# Patient Record
Sex: Female | Born: 1955 | Race: White | Hispanic: No | Marital: Married | Smoking: Never smoker
Health system: Southern US, Community
[De-identification: ages and names within clinical notes are randomized; demographics above are authoritative.]

## PROBLEM LIST (undated history)

## (undated) DIAGNOSIS — G709 Myoneural disorder, unspecified: Secondary | ICD-10-CM

## (undated) DIAGNOSIS — M1990 Unspecified osteoarthritis, unspecified site: Secondary | ICD-10-CM

## (undated) DIAGNOSIS — J189 Pneumonia, unspecified organism: Secondary | ICD-10-CM

## (undated) DIAGNOSIS — D494 Neoplasm of unspecified behavior of bladder: Secondary | ICD-10-CM

## (undated) DIAGNOSIS — R350 Frequency of micturition: Secondary | ICD-10-CM

## (undated) DIAGNOSIS — R011 Cardiac murmur, unspecified: Secondary | ICD-10-CM

## (undated) DIAGNOSIS — R112 Nausea with vomiting, unspecified: Secondary | ICD-10-CM

## (undated) DIAGNOSIS — C801 Malignant (primary) neoplasm, unspecified: Secondary | ICD-10-CM

## (undated) DIAGNOSIS — I10 Essential (primary) hypertension: Secondary | ICD-10-CM

## (undated) DIAGNOSIS — K759 Inflammatory liver disease, unspecified: Secondary | ICD-10-CM

## (undated) DIAGNOSIS — H269 Unspecified cataract: Secondary | ICD-10-CM

## (undated) DIAGNOSIS — E039 Hypothyroidism, unspecified: Secondary | ICD-10-CM

## (undated) DIAGNOSIS — I209 Angina pectoris, unspecified: Secondary | ICD-10-CM

## (undated) DIAGNOSIS — Z9889 Other specified postprocedural states: Secondary | ICD-10-CM

## (undated) HISTORY — PX: ABLATION: SHX5711

## (undated) HISTORY — PX: CARDIAC CATHETERIZATION: SHX172

## (undated) HISTORY — PX: DILATION AND CURETTAGE OF UTERUS: SHX78

## (undated) HISTORY — PX: CATARACT EXTRACTION, BILATERAL: SHX1313

## (undated) HISTORY — PX: ABDOMINAL HYSTERECTOMY: SHX81

## (undated) HISTORY — PX: CARDIOVASCULAR STRESS TEST: SHX262

---

## 2000-06-19 HISTORY — PX: TOTAL ABDOMINAL HYSTERECTOMY W/ BILATERAL SALPINGOOPHORECTOMY: SHX83

## 2007-06-20 HISTORY — PX: ABDOMINOPLASTY: SUR9

## 2009-01-16 ENCOUNTER — Inpatient Hospital Stay (HOSPITAL_COMMUNITY): Admission: EM | Admit: 2009-01-16 | Discharge: 2009-01-17 | Payer: Self-pay | Admitting: Internal Medicine

## 2009-01-16 ENCOUNTER — Ambulatory Visit: Payer: Self-pay | Admitting: Diagnostic Radiology

## 2010-03-08 DIAGNOSIS — E785 Hyperlipidemia, unspecified: Secondary | ICD-10-CM | POA: Insufficient documentation

## 2010-03-08 DIAGNOSIS — I10 Essential (primary) hypertension: Secondary | ICD-10-CM | POA: Insufficient documentation

## 2010-03-09 ENCOUNTER — Ambulatory Visit: Payer: Self-pay | Admitting: Internal Medicine

## 2010-03-09 DIAGNOSIS — R05 Cough: Secondary | ICD-10-CM

## 2010-03-09 DIAGNOSIS — R0602 Shortness of breath: Secondary | ICD-10-CM | POA: Insufficient documentation

## 2010-03-09 DIAGNOSIS — R059 Cough, unspecified: Secondary | ICD-10-CM | POA: Insufficient documentation

## 2010-04-07 ENCOUNTER — Ambulatory Visit: Payer: Self-pay | Admitting: Internal Medicine

## 2010-04-26 ENCOUNTER — Encounter: Payer: Self-pay | Admitting: Internal Medicine

## 2010-04-26 ENCOUNTER — Ambulatory Visit (HOSPITAL_COMMUNITY): Admission: RE | Admit: 2010-04-26 | Discharge: 2010-04-26 | Payer: Self-pay | Admitting: Internal Medicine

## 2010-05-10 ENCOUNTER — Telehealth: Payer: Self-pay | Admitting: Internal Medicine

## 2010-07-07 ENCOUNTER — Ambulatory Visit
Admission: RE | Admit: 2010-07-07 | Discharge: 2010-07-07 | Payer: Self-pay | Source: Home / Self Care | Attending: Internal Medicine | Admitting: Internal Medicine

## 2010-07-19 NOTE — Progress Notes (Signed)
Summary: cpost 2nd opinion needed   Phone Note Outgoing Call   Summary of Call: mike, I am getting a 2nd opiniiion on his CPST from [facility] Initial call taken by: Kalman Shan MD,  May 10, 2010 6:22 PM  Follow-up for Phone Call        ok, it's not clear what all was done in [place] before she was eval here so I have encouraged her to get a second opinion here with all her records Follow-up by: Nyoka Cowden MD,  May 10, 2010 7:38 PM  Additional Follow-up for Phone Call Additional follow up Details #1::        I felt CPST was relatively normal with no obvious cardiopulmonary abnormality. Dolores Patty, MD, [facility]  May 17, 2010 12:52 PM

## 2010-07-19 NOTE — Assessment & Plan Note (Signed)
Summary: Pulmonary/ f/u unexplained doe > cpst next   Copy to:  Dr. Lovenia Kim Primary Provider/Referring Provider:  Dr. Lovenia Kim  CC:  Cough and dyspnea- no better or worse.  History of Present Illness: 55 yowf  never smoker new onset chest pressure summer 2010  March 09, 2010  1st pulmonary office eval cc doe acute onset eval [facility] [place] [facility] and MI r/o'd with gxt neg  and   reproducible with treadmill but happens just about every time goes up steps (? new problem, not sure cause wasn't going up steps often before).  Spells can last all day and better after a good night's sleep, albuterol doesn't help.  Also has spells at rest that can last all day, most days has sob/chest tightness but not on day of ov, symptoms  assoc with dry cough, not eating related. imp was ? gerd/lpr rec Nexium 40 mg Take  one 30-60 min before first meal of the day and pepcid 20 mg one at bedtime   April 07, 2010 ov  Cough and dyspnea- no better or worse. denies non-adherence to rx.  Pt denies any significant sore throat, dysphagia, itching, sneezing,  nasal congestion or excess secretions,  fever, chills, sweats, unintended wt loss, pleuritic or exertional cp, hempoptysis, change in activity tolerance  orthopnea pnd or leg swelling Pt also denies any obvious fluctuation in symptoms with weather or environmental change or other alleviating or aggravating factors.         Current Medications (verified): 1)  Hydrochlorothiazide 25 Mg Tabs (Hydrochlorothiazide) .... 1/2 Two Times A Day 2)  Nexium 40 Mg  Cpdr (Esomeprazole Magnesium) .... By Mouth Daily. Take One Half Hour Before Eating.- Stopped Taking 3)  Pepcid 20 Mg Tabs (Famotidine) .... Take One By Mouth At Bedtime- Stopped Taking 4)  Vitamin B 500 Mcg .Marland Kitchen.. 1 Two Times A Day 5)  Zinc 100 Mg Tabs (Zinc) .Marland Kitchen.. 1 Once Daily 6)  Vitamin D 400 Unit Caps (Cholecalciferol) .Marland Kitchen.. 1 Once Daily 7)  Aloe Vera 470 Mg Caps (Aloe Vera) .Marland Kitchen.. 1 Once Daily 8)   Magnesium 500 Mg Tabs (Magnesium) .Marland Kitchen.. 1 Once Daily 9)  Acai 500 Mg Caps (Acai) .Marland Kitchen.. 1 Once Daily  Allergies (verified): 1)  ! Pcn  Past History:  Past Medical History:  DYSPNEA (ICD-786.05) COUGH (ICD-786.2) HYPERTENSION (ICD-401.9) HYPERLIPIDEMIA (ICD-272.4) Atypical CP      - Neg LHC  [place]     [facility] w/u Dr Elita Boone > neg 2010    -  CPST ordered April 07, 2010   Vital Signs:  Patient profile:   55 year old female Weight:      162.38 pounds O2 Sat:      98 % on Room air Temp:     98.2 degrees F oral Pulse rate:   88 / minute BP sitting:   128 / 80  (left arm)  Vitals Entered By: Vernie Murders (April 07, 2010 11:08 AM)  O2 Flow:  Room air  Physical Exam  Additional Exam:  amb pleasant but mod anxious wf nad wt 162  March 09, 2010 > 162 April 07, 2010  HEENT: nl dentition, turbinates, and orophanx. Nl external ear canals without cough reflex NECK :  without JVD/Nodes/TM/ nl carotid upstrokes bilaterally LUNGS: no acc muscle use, clear to A and P bilaterally without cough on insp or exp maneuvers CV:  RRR  no s3 or murmur or increase in P2, no edema  ABD:  soft and  nontender with nl excursion in the supine position. No bruits or organomegaly, bowel sounds nl MS:  warm without deformities, calf tenderness, cyanosis or clubbing    Impression & Recommendations:  Problem # 1:  DYSPNEA (ICD-786.05) Says now it's perfectly reproducible with ex which suggests we should be able to identify the cause. See instructions for specific recommendations   Medications Added to Medication List This Visit: 1)  Nexium 40 Mg Cpdr (Esomeprazole magnesium) .... By mouth daily. take one half hour before eating.- stopped taking 2)  Pepcid 20 Mg Tabs (Famotidine) .... Take one by mouth at bedtime- stopped taking 3)  Vitamin B 500 Mcg  .Marland Kitchen.. 1 two times a day 4)  Zinc 100 Mg Tabs (Zinc) .Marland Kitchen.. 1 once daily 5)  Vitamin D 400 Unit Caps (Cholecalciferol) .Marland Kitchen.. 1 once  daily 6)  Aloe Vera 470 Mg Caps (Aloe vera) .Marland Kitchen.. 1 once daily 7)  Magnesium 500 Mg Tabs (Magnesium) .Marland Kitchen.. 1 once daily 8)  Acai 500 Mg Caps (Acai) .Marland Kitchen.. 1 once daily  Other Orders: Misc. Referral (Misc. Ref) Est. Patient Level III (04540)  Patient Instructions: 1)  Stop pepcid but continue the diet: 2)  GERD (REFLUX)  is a common cause of respiratory symptoms. It commonly presents without heartburn and can be treated with medication, but also with lifestyle changes including avoidance of late meals, excessive alcohol, smoking cessation, and avoid fatty foods, chocolate, peppermint, colas, red wine, and acidic juices such as orange juice. NO MINT OR MENTHOL PRODUCTS SO NO COUGH DROPS  3)  USE SUGARLESS CANDY INSTEAD (jolley ranchers)  4)  NO OIL BASED VITAMINS  5)  See Patient Care Coordinator before leaving for cpst

## 2010-07-19 NOTE — Assessment & Plan Note (Signed)
Summary: Pulmonary consultation/ atypical sob/cough  ? ALL gerd   Visit Type:  Initial Consult Copy to:  Dr. Lovenia Kim Primary Provider/Referring Provider:  Dr. Lovenia Kim  CC:  Dyspnea and Cough.  History of Present Illness: 43 yowf  never smoker new onset chest pressure summer 2010  March 09, 2010  1st pulmonary office eval cc doe acute onset eval [facility] [place] [facility] and MI r/o'd with gxt neg  and has not been reproducible with treadmill but happens just about every time goes up steps (? new problem, not sure cause wasn't going up steps often before).  Spells can last all day and better after a good night's sleep, albuterol doesn't help.  Also has spells at rest that can last all day, most days has sob/chest tightness but not on day of ov, symptoms  assoc with dry cough, not eating related.  Pt denies any significant sore throat, dysphagia, itching, sneezing,  nasal congestion or excess secretions,  fever, chills, sweats, unintended wt loss, pleuritic or exertional cp, hempoptysis, change in activity tolerance  orthopnea pnd or leg swelling.  Pt also denies any obvious fluctuation in symptoms with weather or environmental change or other alleviating or aggravating factors.       Current Medications (verified): 1)  Hydrochlorothiazide 25 Mg Tabs (Hydrochlorothiazide) .... 1/2 Two Times A Day  Allergies (verified): 1)  ! Pcn  Past History:  Past Medical History:  DYSPNEA (ICD-786.05) COUGH (ICD-786.2) HYPERTENSION (ICD-401.9) HYPERLIPIDEMIA (ICD-272.4) Atypical CP      - Neg LHC  [place]  Family History: Heart dz- Mother and Father Lymphoma- Father Lung CA- Mother Negative for respiratory diseases or atopy   Social History: Married 2 Sons- oldest lives in [place] Retired Never smoker  Review of Systems       The patient complains of shortness of breath with activity, shortness of breath at rest, non-productive cough, chest pain, weight change, ear ache,  hand/feet swelling, and joint stiffness or pain.  The patient denies productive cough, coughing up blood, irregular heartbeats, acid heartburn, indigestion, loss of appetite, abdominal pain, difficulty swallowing, sore throat, tooth/dental problems, headaches, nasal congestion/difficulty breathing through nose, sneezing, itching, anxiety, depression, rash, change in color of mucus, and fever.    Vital Signs:  Patient profile:   55 year old female Height:      63 inches Weight:      162.25 pounds BMI:     28.85 O2 Sat:      99 % on Room air Temp:     97.8 degrees F oral Pulse rate:   69 / minute BP sitting:   120 / 80  (left arm)  Vitals Entered By: Vernie Murders (March 09, 2010 1:44 PM)  O2 Flow:  Room air  Physical Exam  Additional Exam:  amb pleasant but mod anxious wf nad wt 162  March 09, 2010 HEENT: nl dentition, turbinates, and orophanx. Nl external ear canals without cough reflex NECK :  without JVD/Nodes/TM/ nl carotid upstrokes bilaterally LUNGS: no acc muscle use, clear to A and P bilaterally without cough on insp or exp maneuvers CV:  RRR  no s3 or murmur or increase in P2, no edema  ABD:  soft and nontender with nl excursion in the supine position. No bruits or organomegaly, bowel sounds nl MS:  warm without deformities, calf tenderness, cyanosis or clubbing SKIN: warm and dry without lesions   NEURO:  alert, approp, no deficits     CXR  Procedure date:  01/16/2009  Findings:      wnl  Impression & Recommendations:  Problem # 1:  DYSPNEA (ICD-786.05)  Assoc  with dry cough and atypical cp with neg cardiac w/u x 2 including LHC in [place] and neg response to saba  Try aggressive rx x one month with ppi in am and h2 at hs to see if any benefit and if not will do cpst with spirometry before and after bronchodilators while still on suppressive rx.   Each maintenance medication was reviewed in detail including most importantly the difference  between maintenance and as needed and under what circumstances the prns are to be used.  See instructions for specific recommendations   Orders: Consultation Level V (29562)  Problem # 2:  COUGH (ICD-786.2) The most common causes of chronic cough in immunocompetent adults include: upper airway cough syndrome (UACS), previously referred to as postnasal drip syndrome,  caused by variety of rhinosinus conditions; (2) asthma; (3) GERD; (4) chronic bronchitis from cigarette smoking or other inhaled environmental irritants; (5) nonasthmatic eosinophilic bronchitis; and (6) bronchiectasis. These conditions, singly or in combination, have accounted for up to 94% of the causes of chronic cough in prospective studies.   Most likely this is  Classic Upper airway cough syndrome, so named because it's frequently impossible to sort out how much is  CR/sinusitis with freq throat clearing (which can be related to primary GERD)   vs  causing  secondary extra esophageal GERD from wide swings in gastric pressure that occur with throat clearing, promoting self use of mint and menthol lozenges that reduce the lower esophageal sphincter tone and exacerbate the problem further These are the same pts who not infrequently have failed to tolerate ace inhibitors,  dry powder inhalers or biphosphonates or report having reflux symptoms that don't respond to standard doses of PPI.  See instructions for specific recommendations   Medications Added to Medication List This Visit: 1)  Hydrochlorothiazide 25 Mg Tabs (Hydrochlorothiazide) .... 1/2 two times a day 2)  Nexium 40 Mg Cpdr (Esomeprazole magnesium) .... By mouth daily. take one half hour before eating. 3)  Pepcid 20 Mg Tabs (Famotidine) .... Take one by mouth at bedtime  Patient Instructions: 1)  Nexium 40 mg Take  one 30-60 min before first meal of the day and pepcid 20 mg one at bedtime 2)  GERD (REFLUX)  is a common cause of respiratory symptoms. It commonly presents  without heartburn and can be treated with medication, but also with lifestyle changes including avoidance of late meals, excessive alcohol, smoking cessation, and avoid fatty foods, chocolate, peppermint, colas, red wine, and acidic juices such as orange juice. NO MINT OR MENTHOL PRODUCTS SO NO COUGH DROPS  3)  USE SUGARLESS CANDY INSTEAD (jolley ranchers)  4)  NO OIL BASED VITAMINS  5)  NB the  ramp to expected improvement (and for that matter, worsening, if a chronic effective medication is stopped)  can be measured in weeks, not days, a common misconception because this is not Heartburn with no immediate cause and effect relationship so that response to therapy or lack thereof can be very difficult to assess.  6)  Please schedule a follow-up appointment in 4 weeks, sooner if needed  Prescriptions: PEPCID 20 MG TABS (FAMOTIDINE) take one by mouth at bedtime  #34 x 11   Entered and Authorized by:   Nyoka Cowden MD   Signed by:   Nyoka Cowden MD on 03/09/2010   Method used:  Print then Give to Patient   RxID:   [id number] NEXIUM 40 MG  CPDR (ESOMEPRAZOLE MAGNESIUM) By mouth daily. Take one half hour before eating.  #34 x 11   Entered and Authorized by:   Nyoka Cowden MD   Signed by:   Nyoka Cowden MD on 03/09/2010   Method used:   Print then Give to Patient   RxID:   [id number]

## 2010-07-21 NOTE — Assessment & Plan Note (Signed)
Summary: Pulmonary/ ext summary of    Copy to:  Dr. Lovenia Kim Primary Provider/Referring Provider:  Dr. Lovenia Kim  CC:  Dyspnea- slightly worse.  History of Present Illness: 65 yowf  never smoker new onset chest pressure summer 2010  March 09, 2010  1st pulmonary office eval cc doe acute onset eval [facility] [place] ER and MI r/o'd with gxt neg  and   reproducible with treadmill but happens just about every time goes up steps (? new problem, not sure cause wasn't going up steps often before).  Spells can last all day and better after a good night's sleep, albuterol doesn't help.  Also has spells at rest that can last all day, most days has sob/chest tightness but not on day of ov, symptoms  assoc with dry cough, not eating related. imp was ? gerd/lpr rec Nexium 40 mg Take  one 30-60 min before first meal of the day and pepcid 20 mg one at bedtime   April 07, 2010 ov  Cough and dyspnea- no better or worse. denies non-adherence to rx.  rec stop pepcid, take diet, do cpst  July 07, 2010  ov doe that occurs with steps was reproduced perfectly on cpst but there's another problem that w/in an hours of stopping exercise gets tight in chest and that symptom persists until sleeps overnight assoc with nausea sometimes.  sometimes occurs without any exertion at all like when gets nevous driving.  Pt denies any significant sore throat, dysphagia, itching, sneezing,  nasal congestion or excess secretions,  fever, chills, sweats, unintended wt loss, pleuritic or exertional cp, hempoptysis, variabilty  in activity tolerance  orthopnea pnd or leg swelling    Current Medications (verified): 1)  Hydrochlorothiazide 25 Mg Tabs (Hydrochlorothiazide) .... 1/2 Two Times A Day 2)  Vitamin B 500 Mcg .Marland Kitchen.. 1 Two Times A Day 3)  Zinc 100 Mg Tabs (Zinc) .Marland Kitchen.. 1 Once Daily 4)  Vitamin D 400 Unit Caps (Cholecalciferol) .Marland Kitchen.. 1 Once Daily 5)  Aloe Vera 470 Mg Caps (Aloe Vera) .Marland Kitchen.. 1 Once Daily 6)  Magnesium 500 Mg  Tabs (Magnesium) .Marland Kitchen.. 1 Once Daily 7)  Acai 500 Mg Caps (Acai) .Marland Kitchen.. 1 Once Daily  Allergies (verified): 1)  ! Pcn  Past History:  Past Medical History:  DYSPNEA (ICD-786.05) COUGH (ICD-786.2) HYPERTENSION (ICD-401.9) HYPERLIPIDEMIA (ICD-272.4) Atypical CP      - Neg LHC  [place]     [facility] w/u Dr Elita Boone > neg 2010    -  CPST  11/8/ 2011 >>>  nl  Vital Signs:  Patient profile:   55 year old female Weight:      166.25 pounds O2 Sat:      100 % on Room air Temp:     97.7 degrees F oral Pulse rate:   70 / minute BP sitting:   100 / 60  (left arm)  Vitals Entered By: Vernie Murders (July 07, 2010 11:12 AM)  O2 Flow:  Room air  Physical Exam  Additional Exam:  amb pleasant but mod anxious wf nad wt 162  March 09, 2010 > 162 April 07, 2010 > 166 July 07, 2010  HEENT: nl dentition, turbinates, and orophanx. Nl external ear canals without cough reflex NECK :  without JVD/Nodes/TM/ nl carotid upstrokes bilaterally LUNGS: no acc muscle use, clear to A and P bilaterally without cough on insp or exp maneuvers CV:  RRR  no s3 or murmur or increase in P2, no edema  ABD:  soft and nontender with nl excursion in the supine position. No bruits or organomegaly, bowel sounds nl MS:  warm without deformities, calf tenderness, cyanosis or clubbing    Impression & Recommendations:  Problem # 1:  DYSPNEA (ICD-786.05)  I had an extended discussion with the patient today lasting 15 to 20 minutes of a 25 minute visit on the following issues:  Reviewed results of cpst to emphasize there's no physiologic limit to her aerobic capacity.  See instructions for specific recommendations   Orders: Est. Patient Level IV (16109)  Patient Instructions: 1)  Try to build up to 30 min of exercise where you are  short of breath continuously but never out of breath for 30 min minum of 3 x a week but you need to learn to pace yourself carefully 2)  If not improving make an  appointment to see Dr Marchelle Gearing in 6 week

## 2010-09-24 LAB — COMPREHENSIVE METABOLIC PANEL
ALT: 25 U/L (ref 0–35)
AST: 27 U/L (ref 0–37)
Albumin: 3.4 g/dL — ABNORMAL LOW (ref 3.5–5.2)
Alkaline Phosphatase: 87 U/L (ref 39–117)
BUN: 10 mg/dL (ref 6–23)
CO2: 27 mEq/L (ref 19–32)
Calcium: 9.8 mg/dL (ref 8.4–10.5)
Chloride: 109 mEq/L (ref 96–112)
Creatinine, Ser: 1 mg/dL (ref 0.4–1.2)
GFR calc Af Amer: >60 mL/min (ref >60)
GFR calc non Af Amer: 58 mL/min — ABNORMAL LOW (ref >60)
Glucose, Bld: 101 mg/dL — ABNORMAL HIGH (ref 70–99)
Potassium: 4.5 mEq/L (ref 3.5–5.1)
Sodium: 139 mEq/L (ref 135–145)
Total Bilirubin: 0.7 mg/dL (ref 0.3–1.2)
Total Protein: 6.2 g/dL (ref 6.0–8.3)

## 2010-09-24 LAB — CARDIAC PANEL(CRET KIN+CKTOT+MB+TROPI)
CK, MB: 1.5 ng/mL (ref 0.3–4.0)
Relative Index: INVALID (ref 0.0–2.5)
Total CK: 48 U/L (ref 7–177)
Troponin I: 0.01 ng/mL (ref 0.00–0.06)

## 2010-09-25 LAB — DIFFERENTIAL
Basophils Absolute: 0 10*3/uL (ref 0.0–0.1)
Basophils Relative: 1 % (ref 0–1)
Eosinophils Absolute: 0.1 10*3/uL (ref 0.0–0.7)
Eosinophils Relative: 2 % (ref 0–5)
Lymphocytes Relative: 29 % (ref 12–46)
Lymphs Abs: 1.3 10*3/uL (ref 0.7–4.0)
Monocytes Absolute: 0.4 10*3/uL (ref 0.1–1.0)
Monocytes Relative: 9 % (ref 3–12)
Neutro Abs: 2.8 10*3/uL (ref 1.7–7.7)
Neutrophils Relative %: 60 % (ref 43–77)

## 2010-09-25 LAB — CBC
HCT: 44.1 % (ref 36.0–46.0)
Hemoglobin: 14.9 g/dL (ref 12.0–15.0)
MCHC: 33.7 g/dL (ref 30.0–36.0)
MCV: 93.3 fL (ref 78.0–100.0)
Platelets: 300 10*3/uL (ref 150–400)
RBC: 4.73 MIL/uL (ref 3.87–5.11)
RDW: 12.2 % (ref 11.5–15.5)
WBC: 4.6 10*3/uL (ref 4.0–10.5)

## 2010-09-25 LAB — URINALYSIS, ROUTINE W REFLEX MICROSCOPIC
Bilirubin Urine: NEGATIVE
Glucose, UA: NEGATIVE mg/dL
Hgb urine dipstick: NEGATIVE
Ketones, ur: NEGATIVE mg/dL
Nitrite: NEGATIVE
Protein, ur: NEGATIVE mg/dL
Specific Gravity, Urine: 1.012 (ref 1.005–1.030)
Urobilinogen, UA: 0.2 mg/dL (ref 0.0–1.0)
pH: 6.5 (ref 5.0–8.0)

## 2010-09-25 LAB — CARDIAC PANEL(CRET KIN+CKTOT+MB+TROPI)
CK, MB: 1.2 ng/mL (ref 0.3–4.0)
CK, MB: 1.4 ng/mL (ref 0.3–4.0)
Relative Index: INVALID (ref 0.0–2.5)
Relative Index: INVALID (ref 0.0–2.5)
Total CK: 31 U/L (ref 7–177)
Total CK: 61 U/L (ref 7–177)
Troponin I: 0.01 ng/mL (ref 0.00–0.06)
Troponin I: 0.02 ng/mL (ref 0.00–0.06)

## 2010-09-25 LAB — URINE MICROSCOPIC-ADD ON

## 2010-09-25 LAB — POCT CARDIAC MARKERS
CKMB, poc: 1.2 ng/mL (ref 1.0–8.0)
CKMB, poc: 2.5 ng/mL (ref 1.0–8.0)
Myoglobin, poc: 39 ng/mL (ref 12–200)
Myoglobin, poc: 42.7 ng/mL (ref 12–200)
Troponin i, poc: <0.05 ng/mL (ref 0.00–0.09)
Troponin i, poc: <0.05 ng/mL (ref 0.00–0.09)

## 2010-09-25 LAB — COMPREHENSIVE METABOLIC PANEL
ALT: 32 U/L (ref 0–35)
AST: 38 U/L — ABNORMAL HIGH (ref 0–37)
Albumin: 4.4 g/dL (ref 3.5–5.2)
Alkaline Phosphatase: 125 U/L — ABNORMAL HIGH (ref 39–117)
BUN: 14 mg/dL (ref 6–23)
CO2: 28 mEq/L (ref 19–32)
Calcium: 10.7 mg/dL — ABNORMAL HIGH (ref 8.4–10.5)
Chloride: 106 mEq/L (ref 96–112)
Creatinine, Ser: 0.9 mg/dL (ref 0.4–1.2)
GFR calc Af Amer: >60 mL/min (ref >60)
GFR calc non Af Amer: >60 mL/min (ref >60)
Glucose, Bld: 97 mg/dL (ref 70–99)
Potassium: 3.3 mEq/L — ABNORMAL LOW (ref 3.5–5.1)
Sodium: 147 mEq/L — ABNORMAL HIGH (ref 135–145)
Total Bilirubin: 0.3 mg/dL (ref 0.3–1.2)
Total Protein: 7.8 g/dL (ref 6.0–8.3)

## 2010-09-25 LAB — URINE CULTURE
Colony Count: NO GROWTH
Culture: NO GROWTH

## 2010-09-25 LAB — LIPASE, BLOOD: Lipase: 126 U/L (ref 23–300)

## 2010-11-01 NOTE — Discharge Summary (Signed)
Desiree Bryant                ACCOUNT NO.:  [id number]   MEDICAL RECORD NO.:  [id number]          PATIENT TYPE:  INP   LOCATION:  3703                         FACILITY:  [facility]   PHYSICIAN:  Ramiro Harvest, MD    DATE OF BIRTH:  16-Dec-1955   DATE OF ADMISSION:  01/16/2009  DATE OF DISCHARGE:  01/17/2009                               DISCHARGE SUMMARY   PCP:  Carolynn Sayers of [facility].   DISCHARGE DIAGNOSES:  1. Chest pain, resolved.  2. Abnormal liver function test, resolved.  3. Hypokalemia, resolved.   DISCHARGE MEDICATIONS:  1. Estradiol 0.25 mg p.o. every 3 days.  2. B vitamins daily.  3. Zinc p.o. daily.  4. Bilberry daily.  5. Aloe daily.  6. Aspirin 81 mg p.o. daily.  7. Nitroglycerin 0.4 mg sublingual q.5 minutes p.r.n. chest pain.   DISPOSITION AND FOLLOW-UP:  The patient will be discharged home.  The  patient is to follow up with her PCP, Carolynn Sayers, in the next 1-2  weeks.  On follow-up, a BMET will need to be checked to follow up on  patient's electrolytes.  Patient will also need an outpatient stress  test to be scheduled per [facility].  The patient will be called  with time and date of stress test.  This was discussed with Dr. Verdis Prime and a message was left at [facility].   CONSULTATIONS DONE:  None.   PROCEDURES PERFORMED:  1. A chest x-ray was done January 16, 2009, that showed no active      disease.  2. Abdominal ultrasound was done January 17, 2009, that was a negative      abdominal ultrasound, no gallstones, no ductal dilatation.   BRIEF ADMISSION HISTORY AND PHYSICAL:  Desiree Bryant is a 55 year old  white female with no significant past medical history who presented with  chest pain.  It was noted that she had chest pain in the past, had [place]  [place] which was  with clean coronaries per the patient's report.  The patient stated that  on the day prior to  admission she was driving her son around, most of  the day felt hot and dizzy and at the end of the day felt very tired and  went to bed early.  The patient awoke at 1:30 a.m. on the morning of  admission with chest pain described as a pressure 2/3 in intensity,  midsternal in location.  The patient admitted to associated nausea but  no emesis and also some associated shortness of breath.  The patient  admitted to some jaw pain as well and stated that she was just sore all  over.  The patient took some Advil gel tabs, that helped with the jaw  pain and generalized soreness but continued to have chest pressure.  Patient admitted with some associated dizziness.  Patient denied any  diaphoresis.  Pain was nonexertional.  The pain lasted for approximately  4 hours.  The patient arrived at the [facility] ED at 5:30, was  given  some IV pain meds which relieved her pain.  She denied any cough, no  fevers, no diarrhea, no melena, no dysuria, no fevers, no focal  weakness, no blurry vision, no headaches, no belching.  At the [facility] ED a chest x-ray was done, showed no acute disease.  LFTs revealed  an alkaline phosphatase of 125, AST of 38, ALT of 32.  Point of care  cardiac markers were negative.  Her electrocardiogram did not show any  acute ischemic changes.  The patient was admitted for further evaluation  and management.   PHYSICAL EXAM:  Per admitting physician.  Temperature of 97.8, pulse of  67, respiratory rate 18, blood pressure 135/82, saturating 100% on room  air.  GENERAL:  The patient was a pleasant, middle-aged white female in no  apparent distress.  HEENT:  Normocephalic, atraumatic.  Pupils equal, round and reactive to  light and accommodation.  Extraocular movements intact.  Sclerae was  anicteric.  Moist mucous membranes.  No oral exudates.  NECK:  Supple.  No adenopathy.  No thyromegaly.  No JVD.  No carotid  bruits.  LUNGS:  Clear to auscultation bilaterally.  No  wheezes, no crackles.  CARDIOVASCULAR:  Regular rate, rhythm.  Normal S1-S2.  ABDOMEN:  Soft, positive bowel sounds.  Some epigastric tenderness.  No  right upper quadrant tenderness, nondistended, no organomegaly, no  masses palpable.  EXTREMITIES:  No clubbing, cyanosis or edema.  NEUROLOGICALLY:  The patient was alert and oriented x3.  Cranial nerves  II-XII were grossly intact.  No focal deficits.   HOSPITAL COURSE:  1. Chest pain.  The patient was admitted with chest pain which was      felt to be either cardiac or GI-related secondary to her epigastric      tenderness as well.  The patient had a negative cardiac      catheterization in 2006, per patient report, with normal      coronaries.  Cardiac enzymes were cycled x3 which came back      negative.  The patient was placed on aspirin as needed and      nitroglycerin.  The patient remained chest pain free throughout the      rest of the hospitalization.  The patient was monitored.  An      abdominal ultrasound was done to rule out gallbladder disease which      was negative.  On day of discharge the patient was in stable and      improved condition.  The patient will follow up as an outpatient      for outpatient stress test, per Dr. Verdis Prime, which will be      scheduled.  The patient will be called with an appointment for an      outpatient stress test for further workup of this chest pain.  The      patient will also follow-up with her PCP in 1-2 weeks.  On follow-      up, a BMET will need to be checked to follow up on patient's      electrolytes.  The patient will be discharged in stable and      improved condition for outpatient stress test.  2. Abnormal liver function tests.  On admission the patient was noted      to have mildly elevated LFTs.  The patient was hydrated.  An      abdominal ultrasound was done which was negative.  The  patient's      LFTs were rechecked on August 1st and had normalized and resolved       by then.  The patient did not have any abdominal complaints and      these had resolved by day of discharge.  3. Hypokalemia.  On admission, the patient was noted to be      hypokalemic.  The patient's potassium was repleted and the      patient's hypokalemia had resolved by day of discharge.   On day of discharge, vital signs:  Temperature 97.8, pulse of 84,  respirations 18, blood pressure 114/70, saturating 100% on room air.   DISCHARGE LABS:  Sodium 139, potassium 4.5, chloride 109, bicarbonate  27, glucose 101, BUN 10, creatinine 1.00, bilirubin of 0.7, alkaline  phosphatase 87, AST 27, ALT 25, protein of 6.2, albumin of 3.4, calcium  of 9.8.  An abdominal ultrasound as stated above.   It was a pleasure taking care of Ms. Finis Bud.      Ramiro Harvest, MD  Electronically Signed     Ramiro Harvest, MD  Electronically Signed    DT/MEDQ  D:  01/17/2009  T:  01/17/2009  Job:  [id number]   cc:   Carolynn Sayers, NP  Lyn Records, M.D.

## 2010-11-01 NOTE — H&P (Signed)
Desiree Bryant                ACCOUNT NO.:  [id number]   MEDICAL RECORD NO.:  [id number]          PATIENT TYPE:  INP   LOCATION:  3703                         FACILITY:  [facility]   PHYSICIAN:  Kela Millin, M.D.DATE OF BIRTH:  07-17-1955   DATE OF ADMISSION:  01/16/2009  DATE OF DISCHARGE:  01/16/2009                              HISTORY & PHYSICAL   PRIMARY CARE Jadine Brumley:  Eilene Ghazi, nurse practitioner.   CHIEF COMPLAINT:  Chest pain.   HISTORY OF PRESENT ILLNESS:  The patient is a 55 year old white female  with no significant past medical history who presents with the above  complaints.  It is noted that she has had chest pains in the past and  [place] with clean coronaries  per her report.  She states that yesterday she was driving her son  around most of the day and felt hot and dizzy and at the end of the day  felt very tired and went to bed early.  At 130 early this a.m., she was  awakened with chest pain - described as a pressure, 2-3/10 in intensity,  midsternal in location.  She admits to associated nausea but no  vomiting, also associated shortness of breath.  She admits to some jaw  pain as well but also states that she was sore all over.  She took some  Advil gels and that helped with the jaw pain and generalized soreness,  but she continued to have the chest pressure.  Desiree Bryant also admits to  associated dizziness.  She denies diaphoresis and the pain was  nonexertional.  The pain lasted for about 4 hours, she arrived at the  [facility] the ED at 5:30 and was given IV pain meds which relieved her  pain.  She denies cough, fevers, diarrhea, melena, dysuria, fevers,  focal weakness, blurry vision and no headaches.  She also denies  belching.   At the [facility] ED, a chest x-ray was done which showed no acute  disease, her LFTs revealed an alkaline phosphatase of 125 with SGOT of  38, ALT of 32.  Her point of care  markers were negative times one.  An  EKG did not show any acute ischemic changes.  She is admitted for  further evaluation and management.   PAST MEDICAL HISTORY:  As above.   MEDICATIONS:  1. Nitroglycerin p.r.n.  2. Estradiol 0.25 mg every 3 days.   ALLERGIES:  PENICILLIN.   SOCIAL HISTORY:  She denies tobacco, also denies alcohol.   FAMILY HISTORY:  Her mother had an MI at age 26.  She also had lung  cancer.  Two of her uncles had MIs in their early 73s and her  grandfathers also had MIs at age 45.   REVIEW OF SYSTEMS:  As per HPI.   PHYSICAL EXAM:  GENERAL:  She is a pleasant middle-aged white female, in  no apparent distress.  VITAL SIGNS: Temperature 97.8, pulse 67, respiratory rate 18, blood  pressure 135/82, O2 sat 100% on room air.  HEENT:  PERRL,  EOMI, sclerae anicteric, moist mucous membranes and no  oral exudates.  NECK:  Supple, no adenopathy, no thyromegaly and no JVD, no carotid  bruits appreciated.  LUNGS:  Clear to auscultation bilaterally.  No crackles or wheezes.  CARDIOVASCULAR:  Regular rate and rhythm.  Normal S1-S2.  ABDOMEN:  Soft, bowel sounds present, epigastric tenderness present, no  right upper quadrant tenderness.  Nondistended.  No organomegaly and no  masses palpable.  EXTREMITIES:  No cyanosis and no edema.  NEURO:  Alert and oriented x3.  Cranial nerves II-XII grossly intact.  Nonfocal exam.   LABORATORY DATA:  1. EKG - normal sinus rhythm.  No acute ischemic changes.  2. Chest x-ray - no active disease.  White cell count is 4.6,      hemoglobin 14.9, hematocrit 44.1, platelet count of 300.  Sodium is      147 with a potassium of 3.3, chloride of 106, CO2 of 28, BUN of 14,      chloride 0.9 and glucose is 97, alkaline phosphatase is 125, SGOT      is 38, ALT is 32, calcium is 10.7.   ASSESSMENT AND PLAN:  #1.  Chest pain - as discussed above, cardiac  versus gastrointestinal.  Her last cardiac catheterization was in 2006  per her  report with normal coronaries.  We will cycle cardiac enzymes,  place on aspirin and p.r.n. nitroglycerin.  Follow and consider cardiac  consultation pending enzymes.  We will also obtain a right upper  quadrant ultrasound given her epigastric tenderness on exam and mildly  elevated alkaline phosphatase to evaluate for possible gallstones.  We  will place the patient on PPI.  #2.  Abnormal liver function tests - as above, mildly elevated, recheck  in a.m.  Ultrasound as above.  #3.  Hypokalemia - replace potassium.      Kela Millin, M.D.  Electronically Signed     Kela Millin, M.D.  Electronically Signed    ACV/MEDQ  D:  01/16/2009  T:  01/16/2009  Job:  [id number]   cc:   Foye Clock, NP Cardell Peach

## 2012-06-19 HISTORY — PX: COLONOSCOPY WITH ESOPHAGOGASTRODUODENOSCOPY (EGD): SHX5779

## 2013-10-16 ENCOUNTER — Other Ambulatory Visit (HOSPITAL_BASED_OUTPATIENT_CLINIC_OR_DEPARTMENT_OTHER): Payer: Self-pay | Admitting: Physician Assistant

## 2013-10-16 DIAGNOSIS — R109 Unspecified abdominal pain: Secondary | ICD-10-CM

## 2013-10-17 ENCOUNTER — Ambulatory Visit (HOSPITAL_BASED_OUTPATIENT_CLINIC_OR_DEPARTMENT_OTHER)
Admission: RE | Admit: 2013-10-17 | Discharge: 2013-10-17 | Disposition: A | Discharge status: Home / Self Care | Payer: Federal, State, Local not specified - PPO | Source: Ambulatory Visit | Attending: Physician Assistant | Admitting: Physician Assistant

## 2013-10-17 DIAGNOSIS — R109 Unspecified abdominal pain: Secondary | ICD-10-CM | POA: Insufficient documentation

## 2013-10-17 DIAGNOSIS — R9389 Abnormal findings on diagnostic imaging of other specified body structures: Secondary | ICD-10-CM | POA: Insufficient documentation

## 2013-10-17 MED ORDER — IOHEXOL 300 MG/ML  SOLN
100.0000 mL | Freq: Once | INTRAMUSCULAR | Status: AC | PRN
  Administered 2013-10-17: 100 mL via INTRAVENOUS

## 2013-10-30 ENCOUNTER — Other Ambulatory Visit: Payer: Self-pay | Admitting: Urology

## 2013-11-03 NOTE — H&P (Signed)
History of Present Illness Consult for bladder neoplasm referred by PA [facility] helper. May 2015 patient with about a 3 month history of some left back pain radiating down to the front. She was treated with antibiotics and Flexeril. She denied any specific dysuria. She was seen in follow-up and complained of some abdominal discomfort especially when she bent over. PA helper volume it is felt a mass on palpation of the abdomen therefore obtained a CT of the abdomen and pelvis. This revealed bilateral extrarenal pelvis. At the bladder there was thickening of the anterior dome with adjacent soft tissue stranding. It is concerning for possible bladder neoplasm or neoplasm of a urachal remnant. I reviewed all the images. She does report an early May she had some brownish red colored urine with small clots that cleared after antibiotics. At that time he had some cramping or spasm filling in the bladder when she would void. The symptoms improved after antibiotics. Her urine is clear today. She typically voids with a good stream. She has occasional frequency but no urgency.    She has undergone transabdominal hysterectomy/BSO for a large ovarian cyst that "cover the entire area". It was felt to be benign. She underwent an abdominoplasty in 2009.    She needs cystoscopy and we discussed the risks benefits and alternatives of cystoscopy which she can undergo today while we are doing a pelvic and abdominal exam. She elects to proceed.   Past Medical History Problems  1. History of arthritis (V13.4) 2. History of cardiac murmur (V12.59) 3. History of esophageal reflux (V12.79) 4. History of hepatitis (V12.09) 5. History of hypercholesterolemia (V12.29) 6. History of hypertension (V12.59) 7. History of hypothyroidism (V12.29)  Surgical History Problems  1. History of Abdominoplasty 2. History of Hysterectomy  Current Meds 1. Ibuprofen 600 MG Oral Tablet;  Therapy: (Recorded:14May2015) to Recorded 2.  Levothyroxine Sodium 50 MCG Oral Tablet;  Therapy: (Recorded:14May2015) to Recorded  Allergies Medication  1. Penicillins  Family History Problems  1. Family history of cardiac disorder (V17.49) : Mother, Father 2. Family history of lymphoma (V16.7) : Father 3. Family history of malignant neoplasm (V16.9) : Mother  Social History Problems    Daily caffeine consumption, 2-3 servings a day   Married   Never a smoker   No alcohol use   Retired   Two children  Review of Systems Genitourinary, constitutional, skin, eye, otolaryngeal, hematologic/lymphatic, cardiovascular, pulmonary, endocrine, musculoskeletal, gastrointestinal, neurological and psychiatric system(s) were reviewed and pertinent findings if present are noted.    Vitals Vital Signs [Data Includes: Last 1 Day]  Recorded: [id number] 01:16PM  Height: 5 ft 3 in Weight: 170 lb  BMI Calculated: 30.11 BSA Calculated: 1.8 Blood Pressure: 140 / 88 Temperature: 97.9 F Heart Rate: 75  Physical Exam Constitutional: Well nourished and well developed . No acute distress.  ENT:. The ears and nose are normal in appearance.  Neck: The appearance of the neck is normal and no neck mass is present.  Pulmonary: No respiratory distress and normal respiratory rhythm and effort.  Cardiovascular: Heart rate and rhythm are normal . No peripheral edema.  Abdomen: The abdomen is soft and nontender. No masses are palpated. No CVA tenderness. No hernias are palpable. No hepatosplenomegaly noted.  Lymphatics: The femoral and inguinal nodes are not enlarged or tender.  Skin: Normal skin turgor, no visible rash and no visible skin lesions.  Neuro/Psych:. Mood and affect are appropriate.  Genitourinary:  Chaperone Present: .  Examination of the external genitalia shows normal  female external genitalia and no lesions. The urethra is normal in appearance and not tender. There is no urethral mass. Vaginal exam demonstrates no abnormalities.  The adnexa are palpably normal. The bladder is non tender and not distended. The anus is normal on inspection. The perineum is normal on inspection.    Results/Data Urine [Data Includes: Last 1 Day]   [id number]  COLOR YELLOW   APPEARANCE CLEAR   SPECIFIC GRAVITY 1.015   pH 6.5   GLUCOSE NEG mg/dL  BILIRUBIN NEG   KETONE NEG mg/dL  BLOOD NEG   PROTEIN NEG mg/dL  UROBILINOGEN 0.2 mg/dL  NITRITE NEG   LEUKOCYTE ESTERASE NEG    Old records or history reviewed:Marland Kitchen  The following images/tracing/specimen were independently visualized: Marland Kitchen    Procedure  Procedure: Cystoscopy  Chaperone Present: erica.  Indication: Hematuria. Bladder Mass.  Informed Consent: Risks, benefits, and potential adverse events were discussed and informed consent was obtained from the patient.  Prep: The patient was prepped with betadine.  Antibiotic prophylaxis: Ciprofloxacin.  Procedure Note:  Urethral meatus:. No abnormalities.  Anterior urethra: No abnormalities.  Bladder: Visulization was clear. The ureteral orifices were in the normal anatomic position bilaterally and had clear efflux of urine. Examination of the bladder demonstrated erythematous mucosa located near the dome of the bladder . almost appearance of a flat neoplasm covered with some mucous. The patient tolerated the procedure well.  Complications: None.    Assessment Assessed  1. 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 Follow-up Schedule Surgery Office  Follow-up  Status: Hold For - Appointment   Requested for: [id number] 2. Cysto; Status:Complete;   Done: [id number] Health Maintenance  3. UA With REFLEX; [Do Not Release]; Status:Complete;   Done: [id number] 12:42PM  Discussion/Summary Discussed cystoscopy findings with the patient and the nature risks and benefits of cystoscopy with bladder biopsy and fulguration in the operating room. All questions answered and she elects to proceed.      Signatures Electronically signed  by : Festus Aloe, M.D.; Oct 30 2013  3:10PM EST

## 2013-11-04 ENCOUNTER — Encounter (HOSPITAL_BASED_OUTPATIENT_CLINIC_OR_DEPARTMENT_OTHER): Payer: Self-pay | Admitting: *Deleted

## 2013-11-04 NOTE — Progress Notes (Signed)
NPO AFTER MN.  ARRIVE AT 0930.  NEEDS HG.  WILL TAKE SYNTHROID AM DOS W/ SIPS OF WATER.  

## 2013-11-07 ENCOUNTER — Encounter (HOSPITAL_BASED_OUTPATIENT_CLINIC_OR_DEPARTMENT_OTHER): Payer: Federal, State, Local not specified - PPO | Admitting: Anesthesiology

## 2013-11-07 ENCOUNTER — Ambulatory Visit (HOSPITAL_BASED_OUTPATIENT_CLINIC_OR_DEPARTMENT_OTHER): Payer: Federal, State, Local not specified - PPO | Admitting: Anesthesiology

## 2013-11-07 ENCOUNTER — Encounter (HOSPITAL_BASED_OUTPATIENT_CLINIC_OR_DEPARTMENT_OTHER)
Admission: RE | Disposition: A | Discharge status: Home / Self Care | Payer: Self-pay | Source: Ambulatory Visit | Attending: Urology

## 2013-11-07 ENCOUNTER — Ambulatory Visit (HOSPITAL_BASED_OUTPATIENT_CLINIC_OR_DEPARTMENT_OTHER)
Admission: RE | Admit: 2013-11-07 | Discharge: 2013-11-07 | Disposition: A | Discharge status: Home / Self Care | Payer: Federal, State, Local not specified - PPO | Source: Ambulatory Visit | Attending: Urology | Admitting: Urology

## 2013-11-07 ENCOUNTER — Encounter (HOSPITAL_BASED_OUTPATIENT_CLINIC_OR_DEPARTMENT_OTHER): Payer: Self-pay | Admitting: Anesthesiology

## 2013-11-07 DIAGNOSIS — I10 Essential (primary) hypertension: Secondary | ICD-10-CM | POA: Insufficient documentation

## 2013-11-07 DIAGNOSIS — E039 Hypothyroidism, unspecified: Secondary | ICD-10-CM | POA: Insufficient documentation

## 2013-11-07 DIAGNOSIS — D494 Neoplasm of unspecified behavior of bladder: Secondary | ICD-10-CM | POA: Insufficient documentation

## 2013-11-07 HISTORY — DX: Neoplasm of unspecified behavior of bladder: D49.4

## 2013-11-07 HISTORY — DX: Nausea with vomiting, unspecified: R11.2

## 2013-11-07 HISTORY — DX: Frequency of micturition: R35.0

## 2013-11-07 HISTORY — DX: Unspecified osteoarthritis, unspecified site: M19.90

## 2013-11-07 HISTORY — PX: CYSTOSCOPY WITH BIOPSY: SHX5122

## 2013-11-07 HISTORY — DX: Hypothyroidism, unspecified: E03.9

## 2013-11-07 HISTORY — DX: Other specified postprocedural states: Z98.890

## 2013-11-07 LAB — POCT HEMOGLOBIN-HEMACUE: Hemoglobin: 14.9 g/dL (ref 12.0–15.0)

## 2013-11-07 SURGERY — CYSTOSCOPY, WITH BIOPSY
Anesthesia: General | Site: Bladder

## 2013-11-07 MED ORDER — FENTANYL CITRATE 0.05 MG/ML IJ SOLN
INTRAMUSCULAR | Status: DC | PRN
  Administered 2013-11-07 (×2): 50 ug via INTRAVENOUS

## 2013-11-07 MED ORDER — BELLADONNA ALKALOIDS-OPIUM 16.2-60 MG RE SUPP
RECTAL | Status: AC
  Filled 2013-11-07: qty 1

## 2013-11-07 MED ORDER — ONDANSETRON HCL 4 MG PO TABS: 4.0000 mg | ORAL_TABLET | Freq: Three times a day (TID) | ORAL | Status: DC | PRN

## 2013-11-07 MED ORDER — FENTANYL CITRATE 0.05 MG/ML IJ SOLN
INTRAMUSCULAR | Status: AC
  Filled 2013-11-07: qty 4

## 2013-11-07 MED ORDER — PHENAZOPYRIDINE HCL 200 MG PO TABS: 200.0000 mg | ORAL_TABLET | Freq: Three times a day (TID) | ORAL | Status: DC | PRN

## 2013-11-07 MED ORDER — DEXAMETHASONE SODIUM PHOSPHATE 10 MG/ML IJ SOLN
INTRAMUSCULAR | Status: DC | PRN
  Administered 2013-11-07: 10 mg via INTRAVENOUS

## 2013-11-07 MED ORDER — CIPROFLOXACIN IN D5W 400 MG/200ML IV SOLN
400.0000 mg | INTRAVENOUS | Status: AC
  Administered 2013-11-07: 400 mg via INTRAVENOUS
  Filled 2013-11-07: qty 200

## 2013-11-07 MED ORDER — MIDAZOLAM HCL 2 MG/2ML IJ SOLN
INTRAMUSCULAR | Status: AC
  Filled 2013-11-07: qty 2

## 2013-11-07 MED ORDER — SCOPOLAMINE 1 MG/3DAYS TD PT72
1.0000 | MEDICATED_PATCH | TRANSDERMAL | Status: DC
  Administered 2013-11-07: 1.5 mg via TRANSDERMAL
  Filled 2013-11-07: qty 1

## 2013-11-07 MED ORDER — LIDOCAINE HCL (CARDIAC) 20 MG/ML IV SOLN
INTRAVENOUS | Status: DC | PRN
  Administered 2013-11-07: 80 mg via INTRAVENOUS

## 2013-11-07 MED ORDER — PROMETHAZINE HCL 25 MG/ML IJ SOLN
6.2500 mg | INTRAMUSCULAR | Status: DC | PRN
Start: 2013-11-07 — End: 2013-11-07
  Filled 2013-11-07: qty 1

## 2013-11-07 MED ORDER — KETOROLAC TROMETHAMINE 30 MG/ML IJ SOLN
INTRAMUSCULAR | Status: DC | PRN
  Administered 2013-11-07: 30 mg via INTRAVENOUS

## 2013-11-07 MED ORDER — MIDAZOLAM HCL 5 MG/5ML IJ SOLN
INTRAMUSCULAR | Status: DC | PRN
  Administered 2013-11-07: 2 mg via INTRAVENOUS

## 2013-11-07 MED ORDER — CIPROFLOXACIN HCL 500 MG PO TABS: 500.0000 mg | ORAL_TABLET | Freq: Two times a day (BID) | ORAL | Status: DC

## 2013-11-07 MED ORDER — ACETAMINOPHEN 10 MG/ML IV SOLN
INTRAVENOUS | Status: DC | PRN
  Administered 2013-11-07: 1000 mg via INTRAVENOUS

## 2013-11-07 MED ORDER — LACTATED RINGERS IV SOLN
INTRAVENOUS | Status: DC
  Administered 2013-11-07: 09:00:00 via INTRAVENOUS
  Filled 2013-11-07: qty 1000

## 2013-11-07 MED ORDER — PROPOFOL INFUSION 10 MG/ML OPTIME
INTRAVENOUS | Status: DC | PRN
  Administered 2013-11-07: 200 mL via INTRAVENOUS

## 2013-11-07 MED ORDER — FENTANYL CITRATE 0.05 MG/ML IJ SOLN
25.0000 ug | INTRAMUSCULAR | Status: DC | PRN
  Filled 2013-11-07: qty 1

## 2013-11-07 MED ORDER — ONDANSETRON HCL 4 MG/2ML IJ SOLN
INTRAMUSCULAR | Status: DC | PRN
  Administered 2013-11-07: 4 mg via INTRAVENOUS

## 2013-11-07 MED ORDER — LACTATED RINGERS IV SOLN
INTRAVENOUS | Status: DC
  Filled 2013-11-07: qty 1000

## 2013-11-07 MED ORDER — STERILE WATER FOR IRRIGATION IR SOLN
Status: DC | PRN
  Administered 2013-11-07: 3000 mL via INTRAVESICAL

## 2013-11-07 MED ORDER — SCOPOLAMINE 1 MG/3DAYS TD PT72
1.0000 | MEDICATED_PATCH | Freq: Once | TRANSDERMAL | Status: AC
  Administered 2013-11-07: 1 via TRANSDERMAL
  Filled 2013-11-07: qty 1

## 2013-11-07 SURGICAL SUPPLY — 18 items
BAG DRAIN URO-CYSTO SKYTR STRL (DRAIN) ×2 IMPLANT
CANISTER SUCT LVC 12 LTR MEDI- (MISCELLANEOUS) ×2 IMPLANT
CLOTH BEACON ORANGE TIMEOUT ST (SAFETY) ×2 IMPLANT
DRAPE CAMERA CLOSED 9X96 (DRAPES) ×2 IMPLANT
ELECT REM PT RETURN 9FT ADLT (ELECTROSURGICAL) ×2
ELECTRODE REM PT RTRN 9FT ADLT (ELECTROSURGICAL) ×1 IMPLANT
GLOVE BIO SURGEON STRL SZ 6.5 (GLOVE) ×2 IMPLANT
GLOVE BIO SURGEON STRL SZ7.5 (GLOVE) ×2 IMPLANT
GLOVE INDICATOR 6.5 STRL GRN (GLOVE) ×2 IMPLANT
GOWN STRL REIN XL XLG (GOWN DISPOSABLE) IMPLANT
GOWN STRL REUS W/ TWL LRG LVL3 (GOWN DISPOSABLE) ×1 IMPLANT
GOWN STRL REUS W/ TWL XL LVL3 (GOWN DISPOSABLE) ×1 IMPLANT
GOWN STRL REUS W/TWL LRG LVL3 (GOWN DISPOSABLE) ×1
GOWN STRL REUS W/TWL XL LVL3 (GOWN DISPOSABLE) ×1
NEEDLE HYPO 22GX1.5 SAFETY (NEEDLE) IMPLANT
NS IRRIG 500ML POUR BTL (IV SOLUTION) IMPLANT
PACK CYSTOSCOPY (CUSTOM PROCEDURE TRAY) ×2 IMPLANT
WATER STERILE IRR 3000ML UROMA (IV SOLUTION) ×2 IMPLANT

## 2013-11-07 NOTE — Discharge Instructions (Signed)
Cystoscopy, Care After Refer to this sheet in the next few weeks. These instructions provide you with information on caring for yourself after your procedure. Your caregiver may also give you more specific instructions. Your treatment has been planned according to current medical practices, but problems sometimes occur. Call your caregiver if you have any problems or questions after your procedure. HOME CARE INSTRUCTIONS  Things you can do to ease any discomfort after your procedure include:  Drinking enough water and fluids to keep your urine clear or pale yellow.  Taking a warm bath to relieve any burning feelings.  Urinate every 2 - 3 hours to keep bladder empty.  SEEK IMMEDIATE MEDICAL CARE IF:   You have an increase in blood in your urine.  You notice blood clots in your urine.  You have difficulty passing urine.  You have the chills.  You have abdominal pain.  You have a fever or persistent symptoms for more than 2 3 days.  You have a fever and your symptoms suddenly get worse. MAKE SURE YOU:   Understand these instructions.  Will watch your condition.  Will get help right away if you are not doing well or get worse.     Post Anesthesia Home Care Instructions  Activity: Get plenty of rest for the remainder of the day. A responsible adult should stay with you for 24 hours following the procedure.  For the next 24 hours, DO NOT: -Drive a car -Paediatric nurse -Drink alcoholic beverages -Take any medication unless instructed by your physician -Make any legal decisions or sign important papers.  Meals: Start with liquid foods such as gelatin or soup. Progress to regular foods as tolerated. Avoid greasy, spicy, heavy foods. If nausea and/or vomiting occur, drink only clear liquids until the nausea and/or vomiting subsides. Call your physician if vomiting continues.  Special Instructions/Symptoms: Your throat may feel dry or sore from the anesthesia or the  breathing tube placed in your throat during surgery. If this causes discomfort, gargle with warm salt water. The discomfort should disappear within 24 hours.

## 2013-11-07 NOTE — Op Note (Signed)
Diagnosis: Bladder neoplasm Postoperative diagnosis: Bladder neoplasm  Procedure:  Exam under anesthesia Cystoscopy, bladder biopsy, fulguration  Surgeon: Junious Silk  Anesthesia: Fortune  Type of anesthesia: Gen.  Findings: On exam under anesthesia the bladder and urethra were palpably normal. There were no palpable masses. The vagina was blind-ending from prior hysterectomy. There were no palpable abdominal or suprapubic masses. On cystoscopy the urethra appeared normal. The bladder neck was normal. The trigone ureteral orifices were normal orthotopic position with good clear efflux bilaterally the bladder mucosa was normal apart from the dome where there was erythematous mucosa and telangiectatic mucosa in a region that covered a rectangular area. Biopsy taken x2 a representative portions. There was excellent hemostasis under low pressure. There were no stones or foreign body in the bladder.  Description of procedure: After consent was obtained patient brought to the operating room. After adequate anesthesia she was placed in lithotomy position and prepped and draped in the usual sterile fashion. A timeout was performed to confirm the patient and procedure. An exam under anesthesia was performed. A cystoscope was passed per urethra and the bladder examined with a 12 and 70 lens. The cold cup biopsy forceps were then placed and 2 biopsies were taken of representative portions. The biopsy sites were fulgurated. The mucosa in this area appeared rather fragile. One almost got the impression of glomerulations seen with IC. There was excellent hemostasis at low-pressure. The biopsy site as well as some of the surrounding mucosa required fulguration as the mucosa again was very hypervascular. The bladder was drained and the scope was removed. The patient was awakened and taken to the recovery room in stable condition.  There were no complications. Blood loss was minimal. There were no  drains.  Specimens: Dome bladder biopsy x2.     Description of procedure:

## 2013-11-07 NOTE — Anesthesia Preprocedure Evaluation (Addendum)
Anesthesia Evaluation  Patient identified by MRN, date of birth, ID band Patient awake    Reviewed: Allergy & Precautions, H&P , NPO status , Patient's Chart, lab work & pertinent test results  History of Anesthesia Complications (+) PONV  Airway Mallampati: II TM Distance: >3 FB Neck ROM: Full    Dental  (+) Teeth Intact, Dental Advisory Given   Pulmonary neg pulmonary ROS,  breath sounds clear to auscultation  Pulmonary exam normal       Cardiovascular Exercise Tolerance: Good hypertension, negative cardio ROS  Rhythm:Regular Rate:Normal     Neuro/Psych negative neurological ROS  negative psych ROS   GI/Hepatic negative GI ROS, Neg liver ROS,   Endo/Other  negative endocrine ROSHypothyroidism   Renal/GU negative Renal ROS  negative genitourinary   Musculoskeletal negative musculoskeletal ROS (+)   Abdominal   Peds  Hematology negative hematology ROS (+)   Anesthesia Other Findings   Reproductive/Obstetrics                          Anesthesia Physical Anesthesia Plan  ASA: II  Anesthesia Plan: General   Post-op Pain Management:    Induction: Intravenous  Airway Management Planned: LMA  Additional Equipment:   Intra-op Plan:   Post-operative Plan: Extubation in OR  Informed Consent: I have reviewed the patients History and Physical, chart, labs and discussed the procedure including the risks, benefits and alternatives for the proposed anesthesia with the patient or authorized representative who has indicated his/her understanding and acceptance.   Dental advisory given  Plan Discussed with: CRNA  Anesthesia Plan Comments:         Anesthesia Quick Evaluation

## 2013-11-07 NOTE — Transfer of Care (Signed)
Immediate Anesthesia Transfer of Care Note  Patient: Desiree Bryant  Procedure(s) Performed: Procedure(s): CYSTOSCOPY WITH BLADDER BIOPSY/FULGURATION (N/A)  Patient Location: PACU  Anesthesia Type:General  Level of Consciousness: awake, alert , oriented and patient cooperative  Airway & Oxygen Therapy: Patient Spontanous Breathing and Patient connected to nasal cannula oxygen  Post-op Assessment: Report given to PACU RN and Post -op Vital signs reviewed and stable  Post vital signs: Reviewed and stable  Complications: No apparent anesthesia complications

## 2013-11-07 NOTE — Interval H&P Note (Signed)
History and Physical Interval Note:  11/07/2013 9:49 AM  Desiree Bryant  has presented today for surgery, with the diagnosis of bladder neoplasm  The various methods of treatment have been discussed with the patient and family. After consideration of risks, benefits and other options for treatment, the patient has consented to  Procedure(s): CYSTOSCOPY WITH BLADDER BIOPSY/FULGURATION (N/A) as a surgical intervention .  The patient's history has been reviewed, patient examined, no change in status, stable for surgery.  I have reviewed the patient's chart and labs.  Questions were answered to the patient's satisfaction.     Festus Aloe

## 2013-11-07 NOTE — Anesthesia Procedure Notes (Signed)
Procedure Name: LMA Insertion Date/Time: 11/07/2013 9:58 AM Performed by: Wanita Chamberlain Pre-anesthesia Checklist: Patient identified, Timeout performed, Emergency Drugs available, Suction available and Patient being monitored Patient Re-evaluated:Patient Re-evaluated prior to inductionOxygen Delivery Method: Circle system utilized Preoxygenation: Pre-oxygenation with 100% oxygen Intubation Type: IV induction Ventilation: Mask ventilation without difficulty LMA: LMA inserted LMA Size: 4.0 Number of attempts: 1 Airway Equipment and Method: Bite block Placement Confirmation: breath sounds checked- equal and bilateral Tube secured with: Tape Dental Injury: Teeth and Oropharynx as per pre-operative assessment

## 2013-11-08 NOTE — Anesthesia Postprocedure Evaluation (Signed)
Anesthesia Post Note  Patient: Desiree Bryant  Procedure(s) Performed: Procedure(s) (LRB): CYSTOSCOPY WITH BLADDER BIOPSY/FULGURATION (N/A)  Anesthesia type: General  Patient location: PACU  Post pain: Pain level controlled  Post assessment: Post-op Vital signs reviewed  Last Vitals:  Filed Vitals:   11/07/13 1215  BP: 125/74  Pulse: 64  Temp: 36 C  Resp: 14    Post vital signs: Reviewed  Level of consciousness: sedated  Complications: No apparent anesthesia complications

## 2013-11-11 ENCOUNTER — Encounter (HOSPITAL_BASED_OUTPATIENT_CLINIC_OR_DEPARTMENT_OTHER): Payer: Self-pay | Admitting: Urology

## 2014-01-20 ENCOUNTER — Other Ambulatory Visit: Payer: Self-pay | Admitting: Urology

## 2014-01-20 DIAGNOSIS — Q644 Malformation of urachus: Secondary | ICD-10-CM

## 2014-01-22 ENCOUNTER — Ambulatory Visit (HOSPITAL_COMMUNITY)
Admission: RE | Admit: 2014-01-22 | Discharge: 2014-01-22 | Disposition: A | Discharge status: Home / Self Care | Payer: Federal, State, Local not specified - PPO | Source: Ambulatory Visit | Attending: Urology | Admitting: Urology

## 2014-01-22 DIAGNOSIS — Q644 Malformation of urachus: Secondary | ICD-10-CM

## 2014-01-22 DIAGNOSIS — Z0389 Encounter for observation for other suspected diseases and conditions ruled out: Secondary | ICD-10-CM | POA: Insufficient documentation

## 2014-01-22 MED ORDER — GADOBENATE DIMEGLUMINE 529 MG/ML IV SOLN
20.0000 mL | Freq: Once | INTRAVENOUS | Status: AC | PRN
  Administered 2014-01-22: 16 mL via INTRAVENOUS

## 2015-10-14 DIAGNOSIS — E039 Hypothyroidism, unspecified: Secondary | ICD-10-CM | POA: Diagnosis not present

## 2015-12-16 DIAGNOSIS — I781 Nevus, non-neoplastic: Secondary | ICD-10-CM | POA: Diagnosis not present

## 2015-12-16 DIAGNOSIS — L700 Acne vulgaris: Secondary | ICD-10-CM | POA: Diagnosis not present

## 2015-12-16 DIAGNOSIS — L719 Rosacea, unspecified: Secondary | ICD-10-CM | POA: Diagnosis not present

## 2016-04-11 DIAGNOSIS — R5383 Other fatigue: Secondary | ICD-10-CM | POA: Diagnosis not present

## 2016-04-11 DIAGNOSIS — E039 Hypothyroidism, unspecified: Secondary | ICD-10-CM | POA: Diagnosis not present

## 2016-05-01 DIAGNOSIS — R21 Rash and other nonspecific skin eruption: Secondary | ICD-10-CM | POA: Diagnosis not present

## 2016-07-03 DIAGNOSIS — H01004 Unspecified blepharitis left upper eyelid: Secondary | ICD-10-CM | POA: Diagnosis not present

## 2016-07-03 DIAGNOSIS — L739 Follicular disorder, unspecified: Secondary | ICD-10-CM | POA: Diagnosis not present

## 2016-08-25 DIAGNOSIS — M7062 Trochanteric bursitis, left hip: Secondary | ICD-10-CM | POA: Diagnosis not present

## 2016-10-02 DIAGNOSIS — E039 Hypothyroidism, unspecified: Secondary | ICD-10-CM | POA: Diagnosis not present

## 2016-12-13 DIAGNOSIS — L719 Rosacea, unspecified: Secondary | ICD-10-CM | POA: Diagnosis not present

## 2016-12-28 DIAGNOSIS — M545 Low back pain: Secondary | ICD-10-CM | POA: Diagnosis not present

## 2016-12-28 DIAGNOSIS — R350 Frequency of micturition: Secondary | ICD-10-CM | POA: Diagnosis not present

## 2016-12-28 DIAGNOSIS — R300 Dysuria: Secondary | ICD-10-CM | POA: Diagnosis not present

## 2016-12-28 DIAGNOSIS — R319 Hematuria, unspecified: Secondary | ICD-10-CM | POA: Diagnosis not present

## 2016-12-28 DIAGNOSIS — N390 Urinary tract infection, site not specified: Secondary | ICD-10-CM | POA: Diagnosis not present

## 2017-01-02 DIAGNOSIS — R300 Dysuria: Secondary | ICD-10-CM | POA: Diagnosis not present

## 2017-01-02 DIAGNOSIS — E039 Hypothyroidism, unspecified: Secondary | ICD-10-CM | POA: Diagnosis not present

## 2017-01-03 DIAGNOSIS — D3131 Benign neoplasm of right choroid: Secondary | ICD-10-CM | POA: Diagnosis not present

## 2017-01-03 DIAGNOSIS — H25811 Combined forms of age-related cataract, right eye: Secondary | ICD-10-CM | POA: Diagnosis not present

## 2017-01-03 DIAGNOSIS — H2512 Age-related nuclear cataract, left eye: Secondary | ICD-10-CM | POA: Diagnosis not present

## 2017-01-03 DIAGNOSIS — H18413 Arcus senilis, bilateral: Secondary | ICD-10-CM | POA: Diagnosis not present

## 2017-01-04 DIAGNOSIS — Z1389 Encounter for screening for other disorder: Secondary | ICD-10-CM | POA: Diagnosis not present

## 2017-01-05 ENCOUNTER — Other Ambulatory Visit: Payer: Self-pay | Admitting: Family Medicine

## 2017-01-05 DIAGNOSIS — R1030 Lower abdominal pain, unspecified: Secondary | ICD-10-CM

## 2017-01-09 ENCOUNTER — Ambulatory Visit (HOSPITAL_BASED_OUTPATIENT_CLINIC_OR_DEPARTMENT_OTHER)
Admission: RE | Admit: 2017-01-09 | Discharge: 2017-01-09 | Disposition: A | Discharge status: Home / Self Care | Payer: Federal, State, Local not specified - PPO | Source: Ambulatory Visit | Attending: Family Medicine | Admitting: Family Medicine

## 2017-01-09 DIAGNOSIS — R1030 Lower abdominal pain, unspecified: Secondary | ICD-10-CM | POA: Diagnosis not present

## 2017-01-09 MED ORDER — IOPAMIDOL (ISOVUE-300) INJECTION 61%
100.0000 mL | Freq: Once | INTRAVENOUS | Status: AC | PRN
  Administered 2017-01-09: 100 mL via INTRAVENOUS

## 2017-08-22 DIAGNOSIS — R400 Somnolence: Secondary | ICD-10-CM | POA: Diagnosis not present

## 2017-08-22 DIAGNOSIS — E039 Hypothyroidism, unspecified: Secondary | ICD-10-CM | POA: Diagnosis not present

## 2017-08-22 DIAGNOSIS — Z780 Asymptomatic menopausal state: Secondary | ICD-10-CM | POA: Diagnosis not present

## 2017-08-22 DIAGNOSIS — Z13220 Encounter for screening for lipoid disorders: Secondary | ICD-10-CM | POA: Diagnosis not present

## 2017-10-12 DIAGNOSIS — B029 Zoster without complications: Secondary | ICD-10-CM | POA: Diagnosis not present

## 2017-10-23 DIAGNOSIS — Z8619 Personal history of other infectious and parasitic diseases: Secondary | ICD-10-CM | POA: Diagnosis not present

## 2017-10-23 DIAGNOSIS — R21 Rash and other nonspecific skin eruption: Secondary | ICD-10-CM | POA: Diagnosis not present

## 2018-01-15 DIAGNOSIS — D225 Melanocytic nevi of trunk: Secondary | ICD-10-CM | POA: Diagnosis not present

## 2018-01-15 DIAGNOSIS — L814 Other melanin hyperpigmentation: Secondary | ICD-10-CM | POA: Diagnosis not present

## 2018-01-15 DIAGNOSIS — L821 Other seborrheic keratosis: Secondary | ICD-10-CM | POA: Diagnosis not present

## 2018-01-15 DIAGNOSIS — D1801 Hemangioma of skin and subcutaneous tissue: Secondary | ICD-10-CM | POA: Diagnosis not present

## 2018-01-22 DIAGNOSIS — D485 Neoplasm of uncertain behavior of skin: Secondary | ICD-10-CM | POA: Diagnosis not present

## 2018-01-22 DIAGNOSIS — I781 Nevus, non-neoplastic: Secondary | ICD-10-CM | POA: Diagnosis not present

## 2018-01-24 DIAGNOSIS — H25011 Cortical age-related cataract, right eye: Secondary | ICD-10-CM | POA: Diagnosis not present

## 2018-01-24 DIAGNOSIS — H2513 Age-related nuclear cataract, bilateral: Secondary | ICD-10-CM | POA: Diagnosis not present

## 2018-01-28 DIAGNOSIS — E785 Hyperlipidemia, unspecified: Secondary | ICD-10-CM | POA: Diagnosis not present

## 2018-01-28 DIAGNOSIS — E039 Hypothyroidism, unspecified: Secondary | ICD-10-CM | POA: Diagnosis not present

## 2018-02-07 DIAGNOSIS — H5789 Other specified disorders of eye and adnexa: Secondary | ICD-10-CM | POA: Diagnosis not present

## 2018-03-06 IMAGING — CT CT ABD-PELV W/ CM
2 of 5 series · 16 of 46 positions shown, 18 images · IV contrast (APPLIED)
Comparison: CT abdomen and pelvis of 10/17/2013

CLINICAL DATA: Chronic lower abdomen pain, sharper recently,
history of bladder neoplasm

EXAM:
CT ABDOMEN AND PELVIS WITH CONTRAST
TECHNIQUE: Multidetector CT imaging of the abdomen and pelvis was performed
using the standard protocol following bolus administration of
intravenous contrast.
CONTRAST:  100mL [id number] IOPAMIDOL ([id number]) INJECTION 61%

[Series 2: axial st · axial · 0.79mm/px · z∈[-508,-128]mm · 13 of 88 slices shown, 15 images]
[im 6/88  soft-tissue]
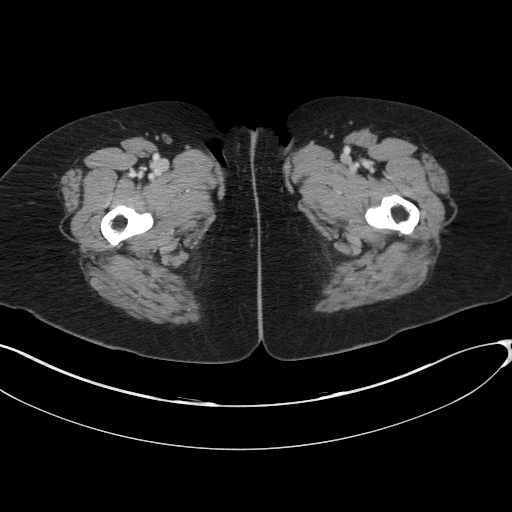
[im 6/88  bone]
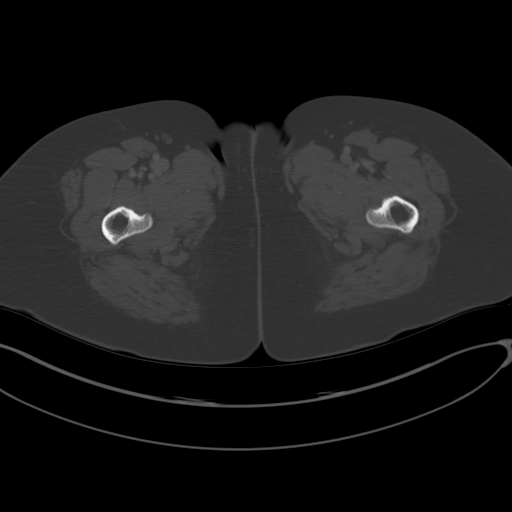
[im 12/88  soft-tissue]
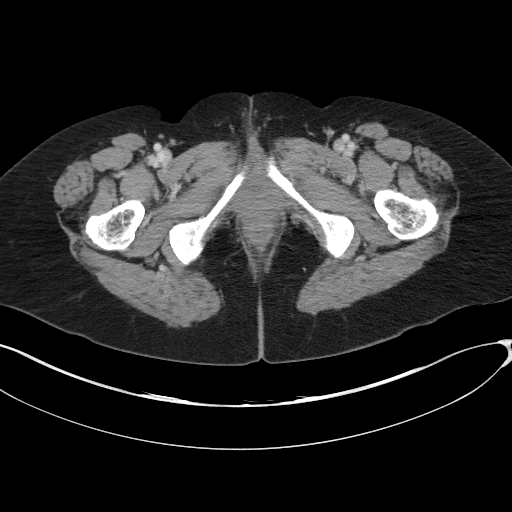
[im 18/88  soft-tissue]
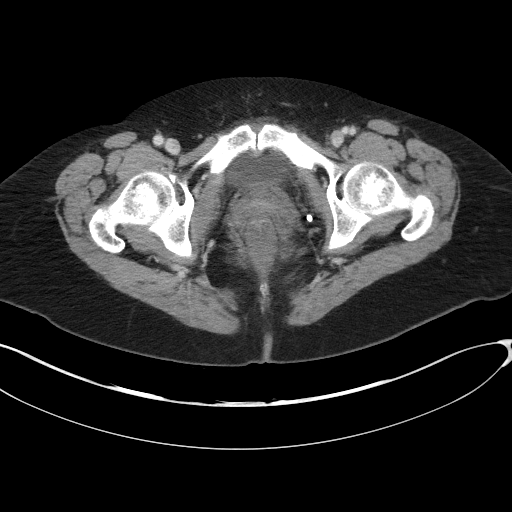
[im 24/88  soft-tissue]
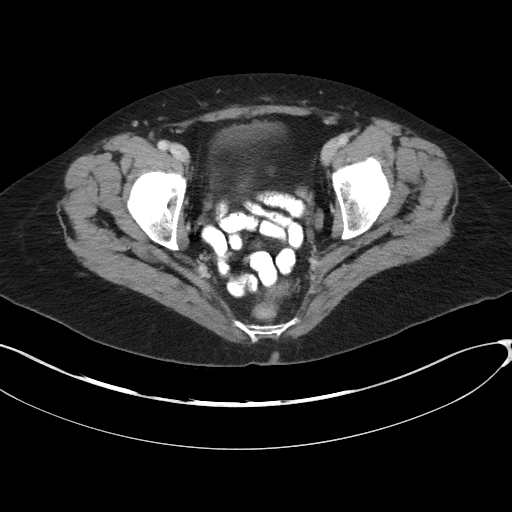
[im 30/88  soft-tissue]
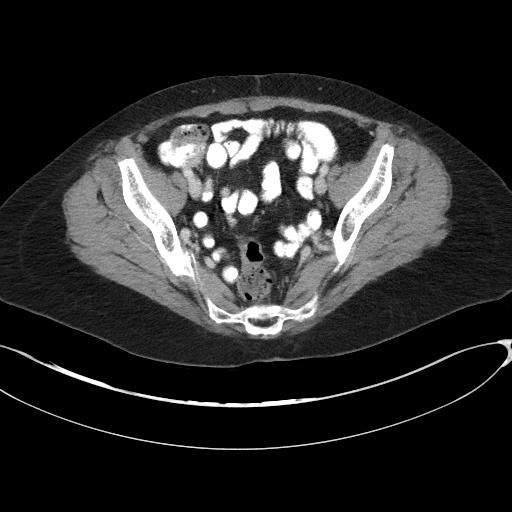
[im 35/88  soft-tissue]
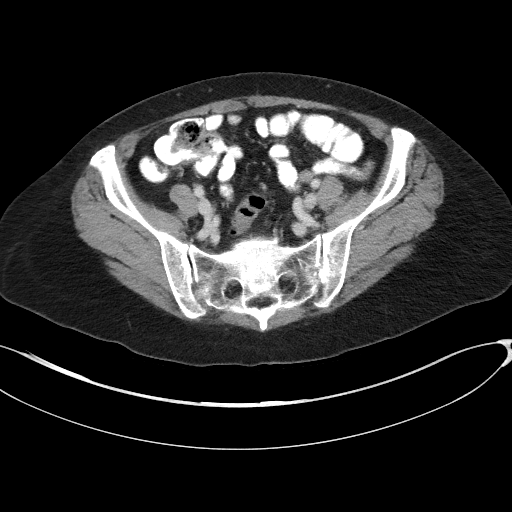
[im 47/88  soft-tissue]
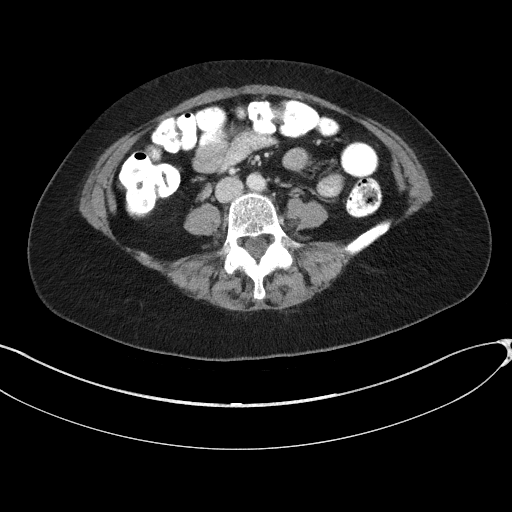
[im 53/88  soft-tissue]
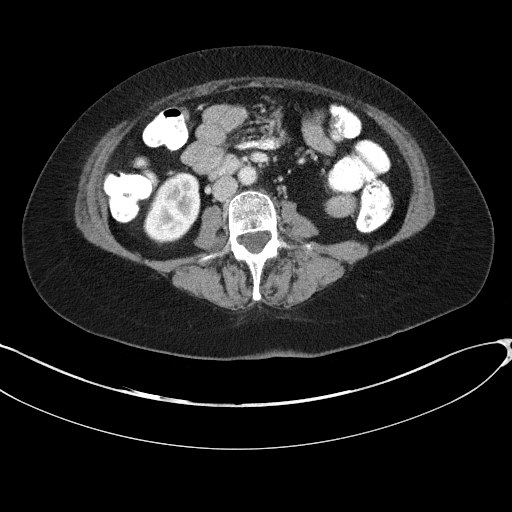
[im 59/88  soft-tissue]
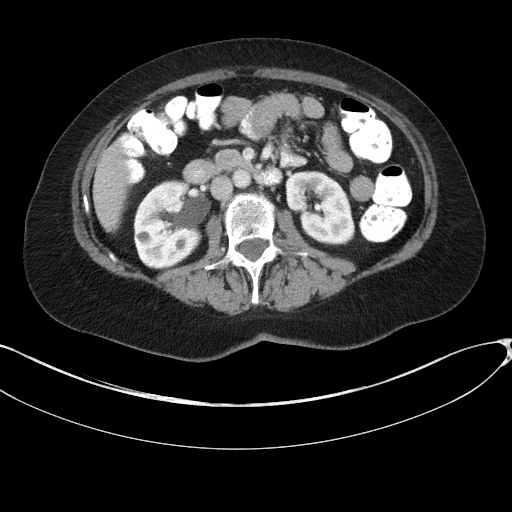
[im 59/88  bone]
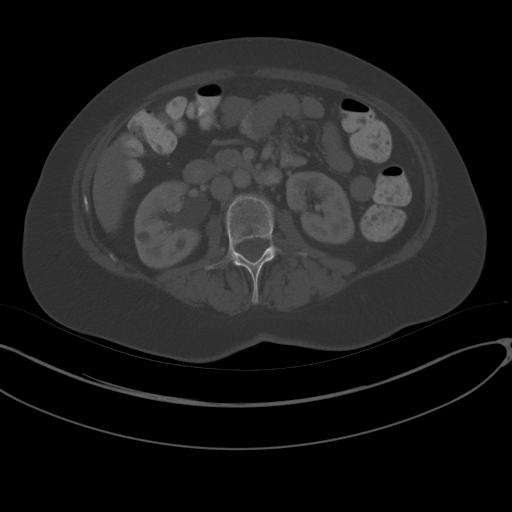
[im 64/88  soft-tissue]
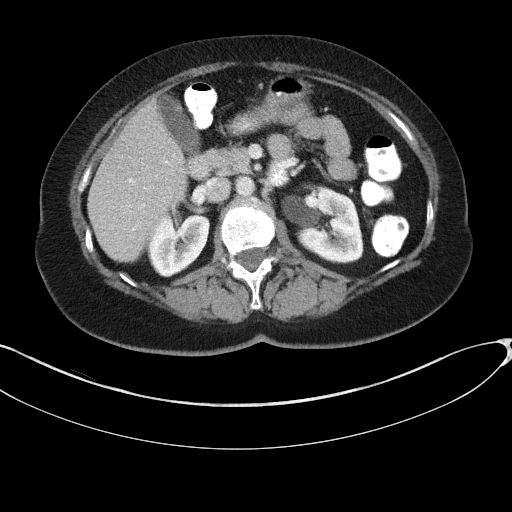
[im 70/88  soft-tissue]
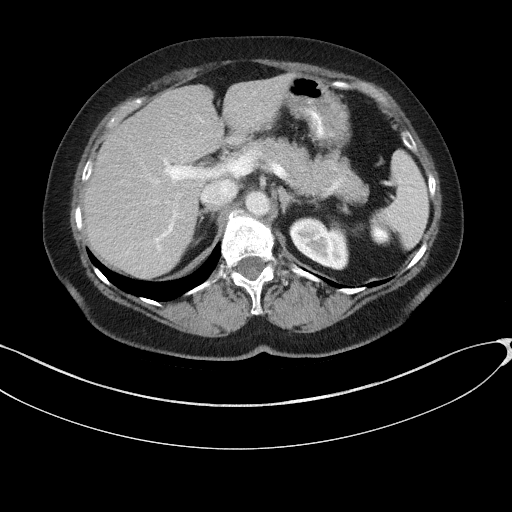
[im 76/88  soft-tissue]
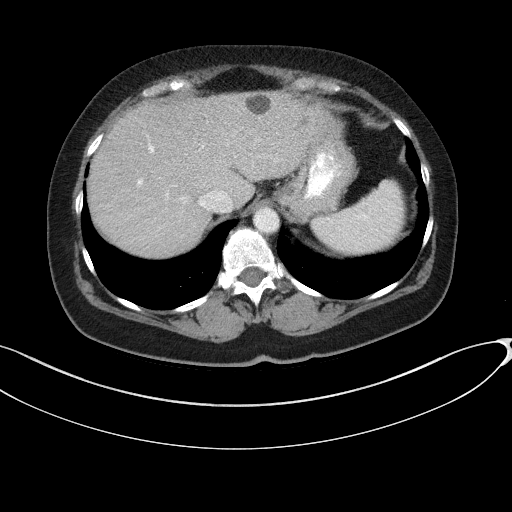
[im 82/88  soft-tissue]
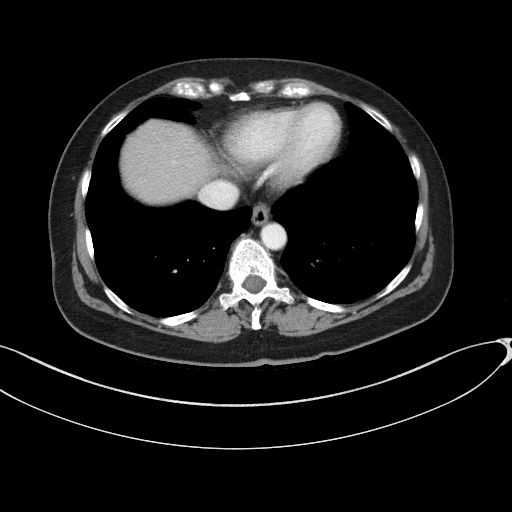

[Series 4: coronal st · coronal · 0.89mm/px · 3 of 89 slices shown]
[im 30/89  soft-tissue]
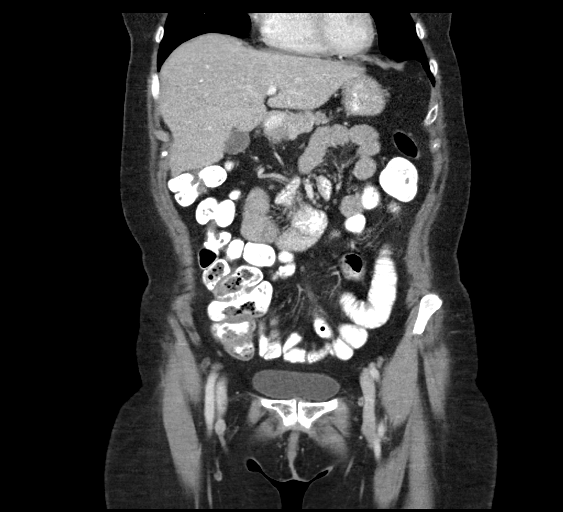
[im 40/89  soft-tissue]
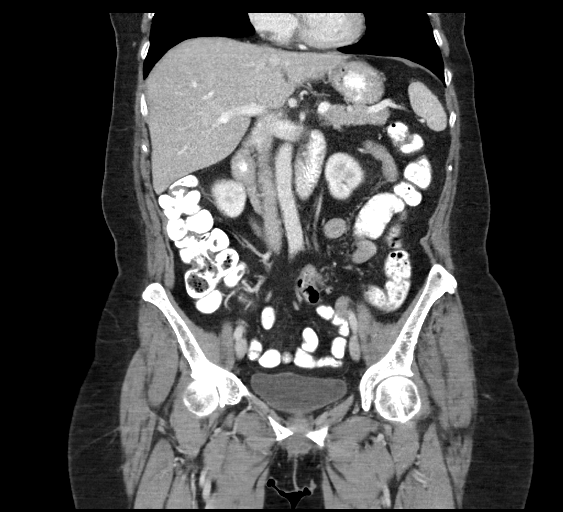
[im 49/89  soft-tissue]
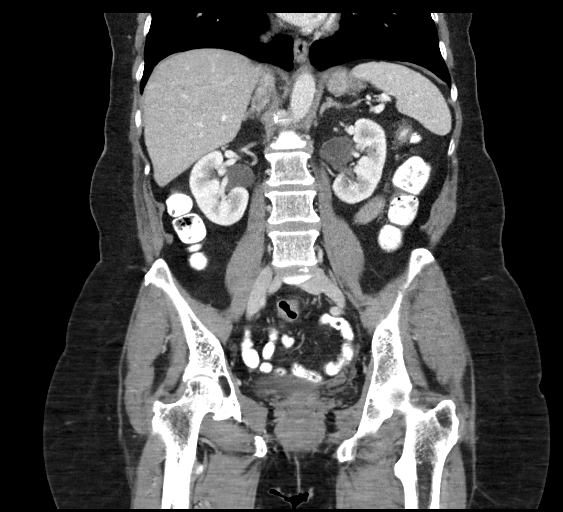

[16 of 46 positions shown; findings below may reference images not displayed]

FINDINGS: Lower chest: The lung bases are clear.

Hepatobiliary: The liver enhances and there are benign-appearing
cysts remaining in the left lobe which are unchanged. No calcified
gallstones are seen.

Pancreas: The pancreas is normal in size and the pancreatic duct is
not dilated.

Spleen: The spleen is unremarkable.

Adrenals/Urinary Tract: The adrenal glands appear normal. The
kidneys enhance with no calculus or mass. A small cyst on the right
is unchanged. Ureters appear normal in caliber. The urinary bladder
is unremarkable. The area questioned near the dome of the bladder on
the prior CT is not prominent currently.

Stomach/Bowel: The stomach is not well distended. No small bowel
distention is seen. No abnormality of the colon is seen. The
terminal ileum appears normal. The appendix is not visualized but no
inflammatory process is noted within the right lower quadrant.

Vascular/Lymphatic: The abdominal aorta is normal caliber. No
adenopathy is seen.

Reproductive: The uterus has previously been resected. No adnexal
lesion is seen. No fluid is noted within the pelvis.

Other: None.

Musculoskeletal: The lumbar vertebrae are in normal alignment with
mild degenerative disc disease at L1-2 and L5-S1 levels.
IMPRESSION: 1. No explanation for the patient's lower abdomen pain is seen.
2. The area questioned within the dome urinary bladder on the prior
CT from 8105 is no longer seen.
3. The terminal ileum appears normal. The appendix is not visualized
but no inflammatory process is noted.

## 2018-04-02 DIAGNOSIS — J029 Acute pharyngitis, unspecified: Secondary | ICD-10-CM | POA: Diagnosis not present

## 2018-07-31 DIAGNOSIS — E785 Hyperlipidemia, unspecified: Secondary | ICD-10-CM | POA: Diagnosis not present

## 2018-07-31 DIAGNOSIS — E039 Hypothyroidism, unspecified: Secondary | ICD-10-CM | POA: Diagnosis not present

## 2018-12-11 DIAGNOSIS — L03211 Cellulitis of face: Secondary | ICD-10-CM | POA: Diagnosis not present

## 2018-12-30 DIAGNOSIS — M79671 Pain in right foot: Secondary | ICD-10-CM | POA: Diagnosis not present

## 2019-01-03 DIAGNOSIS — L821 Other seborrheic keratosis: Secondary | ICD-10-CM | POA: Diagnosis not present

## 2019-01-03 DIAGNOSIS — D2271 Melanocytic nevi of right lower limb, including hip: Secondary | ICD-10-CM | POA: Diagnosis not present

## 2019-01-15 DIAGNOSIS — E785 Hyperlipidemia, unspecified: Secondary | ICD-10-CM | POA: Diagnosis not present

## 2019-01-15 DIAGNOSIS — M7661 Achilles tendinitis, right leg: Secondary | ICD-10-CM | POA: Diagnosis not present

## 2019-02-03 DIAGNOSIS — Z1239 Encounter for other screening for malignant neoplasm of breast: Secondary | ICD-10-CM | POA: Diagnosis not present

## 2019-02-03 DIAGNOSIS — Z1231 Encounter for screening mammogram for malignant neoplasm of breast: Secondary | ICD-10-CM | POA: Diagnosis not present

## 2019-02-07 DIAGNOSIS — M7661 Achilles tendinitis, right leg: Secondary | ICD-10-CM | POA: Diagnosis not present

## 2019-02-07 DIAGNOSIS — M71571 Other bursitis, not elsewhere classified, right ankle and foot: Secondary | ICD-10-CM | POA: Diagnosis not present

## 2019-02-07 DIAGNOSIS — M7731 Calcaneal spur, right foot: Secondary | ICD-10-CM | POA: Diagnosis not present

## 2019-02-13 DIAGNOSIS — M7661 Achilles tendinitis, right leg: Secondary | ICD-10-CM | POA: Diagnosis not present

## 2019-02-13 DIAGNOSIS — M71571 Other bursitis, not elsewhere classified, right ankle and foot: Secondary | ICD-10-CM | POA: Diagnosis not present

## 2019-02-20 DIAGNOSIS — M7661 Achilles tendinitis, right leg: Secondary | ICD-10-CM | POA: Diagnosis not present

## 2019-02-20 DIAGNOSIS — M71571 Other bursitis, not elsewhere classified, right ankle and foot: Secondary | ICD-10-CM | POA: Diagnosis not present

## 2019-03-26 DIAGNOSIS — M7661 Achilles tendinitis, right leg: Secondary | ICD-10-CM | POA: Diagnosis not present

## 2019-04-08 DIAGNOSIS — H16223 Keratoconjunctivitis sicca, not specified as Sjogren's, bilateral: Secondary | ICD-10-CM | POA: Diagnosis not present

## 2019-04-08 DIAGNOSIS — H00024 Hordeolum internum left upper eyelid: Secondary | ICD-10-CM | POA: Diagnosis not present

## 2019-05-12 DIAGNOSIS — E039 Hypothyroidism, unspecified: Secondary | ICD-10-CM | POA: Diagnosis not present

## 2019-05-12 DIAGNOSIS — E785 Hyperlipidemia, unspecified: Secondary | ICD-10-CM | POA: Diagnosis not present

## 2019-05-27 DIAGNOSIS — H16223 Keratoconjunctivitis sicca, not specified as Sjogren's, bilateral: Secondary | ICD-10-CM | POA: Diagnosis not present

## 2019-05-27 DIAGNOSIS — H2513 Age-related nuclear cataract, bilateral: Secondary | ICD-10-CM | POA: Diagnosis not present

## 2019-08-12 DIAGNOSIS — E039 Hypothyroidism, unspecified: Secondary | ICD-10-CM | POA: Diagnosis not present

## 2019-08-18 DIAGNOSIS — M25774 Osteophyte, right foot: Secondary | ICD-10-CM | POA: Diagnosis not present

## 2019-08-18 DIAGNOSIS — L600 Ingrowing nail: Secondary | ICD-10-CM | POA: Diagnosis not present

## 2019-08-25 DIAGNOSIS — L600 Ingrowing nail: Secondary | ICD-10-CM | POA: Diagnosis not present

## 2019-09-18 DIAGNOSIS — M6289 Other specified disorders of muscle: Secondary | ICD-10-CM | POA: Diagnosis not present

## 2019-10-01 DIAGNOSIS — R6884 Jaw pain: Secondary | ICD-10-CM | POA: Diagnosis not present

## 2019-10-18 DIAGNOSIS — R21 Rash and other nonspecific skin eruption: Secondary | ICD-10-CM | POA: Diagnosis not present

## 2019-10-21 DIAGNOSIS — L309 Dermatitis, unspecified: Secondary | ICD-10-CM | POA: Diagnosis not present

## 2019-11-20 DIAGNOSIS — Z872 Personal history of diseases of the skin and subcutaneous tissue: Secondary | ICD-10-CM | POA: Diagnosis not present

## 2019-11-20 DIAGNOSIS — L559 Sunburn, unspecified: Secondary | ICD-10-CM | POA: Diagnosis not present

## 2019-11-20 DIAGNOSIS — L810 Postinflammatory hyperpigmentation: Secondary | ICD-10-CM | POA: Diagnosis not present

## 2020-01-06 DIAGNOSIS — E039 Hypothyroidism, unspecified: Secondary | ICD-10-CM | POA: Diagnosis not present

## 2020-01-23 DIAGNOSIS — L578 Other skin changes due to chronic exposure to nonionizing radiation: Secondary | ICD-10-CM | POA: Diagnosis not present

## 2020-01-23 DIAGNOSIS — L821 Other seborrheic keratosis: Secondary | ICD-10-CM | POA: Diagnosis not present

## 2020-01-23 DIAGNOSIS — L719 Rosacea, unspecified: Secondary | ICD-10-CM | POA: Diagnosis not present

## 2020-01-23 DIAGNOSIS — D2372 Other benign neoplasm of skin of left lower limb, including hip: Secondary | ICD-10-CM | POA: Diagnosis not present

## 2020-06-22 ENCOUNTER — Other Ambulatory Visit: Payer: Self-pay | Admitting: Gastroenterology

## 2020-06-22 DIAGNOSIS — R1013 Epigastric pain: Secondary | ICD-10-CM

## 2020-07-07 ENCOUNTER — Other Ambulatory Visit: Payer: Self-pay

## 2020-07-07 ENCOUNTER — Ambulatory Visit
Admission: RE | Admit: 2020-07-07 | Discharge: 2020-07-07 | Disposition: A | Discharge status: Home / Self Care | Payer: Federal, State, Local not specified - PPO | Source: Ambulatory Visit | Attending: Gastroenterology | Admitting: Gastroenterology

## 2020-07-07 DIAGNOSIS — R1013 Epigastric pain: Secondary | ICD-10-CM

## 2020-07-27 ENCOUNTER — Other Ambulatory Visit: Payer: Self-pay | Admitting: Gastroenterology

## 2020-07-27 DIAGNOSIS — R1013 Epigastric pain: Secondary | ICD-10-CM

## 2020-07-28 ENCOUNTER — Telehealth: Payer: Self-pay

## 2020-07-28 NOTE — Telephone Encounter (Signed)
NOTES ON [facility] [phone], SENT REFERRAL TO [place]

## 2020-07-28 NOTE — Telephone Encounter (Signed)
NOTES ON FILE FROM  [facility] [phone], SENT REFERRAL TO SCHEDULING

## 2020-08-26 ENCOUNTER — Encounter (HOSPITAL_COMMUNITY)
Admission: RE | Admit: 2020-08-26 | Discharge: 2020-08-26 | Disposition: A | Discharge status: Home / Self Care | Payer: Federal, State, Local not specified - PPO | Source: Ambulatory Visit | Attending: Gastroenterology | Admitting: Gastroenterology

## 2020-08-26 ENCOUNTER — Other Ambulatory Visit: Payer: Self-pay

## 2020-08-26 DIAGNOSIS — R1013 Epigastric pain: Secondary | ICD-10-CM | POA: Diagnosis present

## 2020-08-26 MED ORDER — TECHNETIUM TC 99M MEBROFENIN IV KIT
4.9500 | PACK | Freq: Once | INTRAVENOUS | Status: AC | PRN
  Administered 2020-08-26: 4.95 via INTRAVENOUS

## 2020-09-22 NOTE — Progress Notes (Signed)
Cardiology Office Note:    Date:  09/23/2020   ID:  Desiree Bryant, DOB May 28, 1956, MRN [id number]  PCP:  London Pepper, MD  Cardiologist:  No primary care provider on file.   Referring MD: London Pepper, MD   Chief Complaint  Patient presents with  . Hyperlipidemia  . Chest Pain    History of Present Illness:    Desiree Bryant is a 65 y.o. female with a hx of chest pressure on exertion, referred by London Pepper for cardiac evaluation.  She is here for evaluation of chest pain.  For years now she has occasion where the chest becomes extremely tight.  It can last 5 hours, and is not necessarily precipitated by physical activity.  Stress can precipitate the discomfort.  She does not smoke.  She is not diabetic.  Her mother had myocardial infarction in her early 71s.  There is no associated dyspnea with the symptom.  She has had various chest and jaw complaints.  In 2006 she underwent coronary angiography that was unremarkable.  In 2010 she had work-up by Dr. Marlou Porch that included an exercise treadmill test and echocardiogram that were unremarkable.  The recent lipid panel demonstrates severe total cholesterol elevation of 313 with LDL 216 and HDL 80.  Hemoglobin A1c was 5.3.  Past Medical History:  Diagnosis Date  . Arthritis   . Bladder neoplasm   . Frequency of urination   . Hypothyroidism   . PONV (postoperative nausea and vomiting)    SEVERE    Past Surgical History:  Procedure Laterality Date  . ABDOMINOPLASTY  2009  . CARDIAC CATHETERIZATION  2006  Thersa Salt)   normal coronaries  . CARDIOVASCULAR STRESS TEST  03-26-2010  dr Gillian Shields   normal study  . COLONOSCOPY WITH ESOPHAGOGASTRODUODENOSCOPY (EGD)  2014  . CYSTOSCOPY WITH BIOPSY N/A 11/07/2013   Procedure: CYSTOSCOPY WITH BLADDER BIOPSY/FULGURATION;  Surgeon: Festus Aloe, MD;  Location: [facility];  Service: Urology;  Laterality: N/A;  . TOTAL ABDOMINAL HYSTERECTOMY W/ BILATERAL SALPINGOOPHORECTOMY   2002    Current Medications: Current Meds  Medication Sig  . Aloe Vera 470 MG CAPS Take 1 capsule by mouth daily.  . Cholecalciferol 5000 UNITS capsule Take 5,000 Units by mouth daily.  . Chromium 200 MCG TABS Take 1 tablet by mouth daily.  Marland Kitchen levothyroxine (SYNTHROID, LEVOTHROID) 50 MCG tablet Take 50 mcg by mouth daily before breakfast.  . Magnesium 400 MG CAPS Take 1 tablet by mouth daily.  Marland Kitchen zinc sulfate 50 MG CAPS capsule Take 1 tablet by mouth daily.     Allergies:   Penicillins, Strawberry extract, Ginger, Peanut-containing drug products, and Turmeric   Social History   Socioeconomic History  . Marital status: Married    Spouse name: Not on file  . Number of children: Not on file  . Years of education: Not on file  . Highest education level: Not on file  Occupational History  . Not on file  Tobacco Use  . Smoking status: Never Smoker  . Smokeless tobacco: Never Used  Substance and Sexual Activity  . Alcohol use: No  . Drug use: No  . Sexual activity: Not on file  Other Topics Concern  . Not on file  Social History Narrative  . Not on file   Social Determinants of Health   Financial Resource Strain: Not on file  Food Insecurity: Not on file  Transportation Needs: Not on file  Physical Activity: Not on file  Stress: Not on  file  Social Connections: Not on file     Family History: The patient's family history is not on file.  ROS:   Please see the history of present illness.    He denies claudication.  She has not had syncope.  She denies orthopnea and PND.  There is no peripheral edema.  She has never had a stroke or recurring neurological symptoms.  All other systems reviewed and are negative.  EKGs/Labs/Other Studies Reviewed:    The following studies were reviewed today: No new data  EKG:  EKG EKG is with the exception of prominent voltage and left atrial abnormality.  Sinus rhythm is noted.  Recent Labs: No results found for requested labs within  last 8760 hours.  Recent Lipid Panel No results found for: CHOL, TRIG, HDL, CHOLHDL, VLDL, LDLCALC, LDLDIRECT  Physical Exam:    VS:  BP 138/76   Pulse 70   Ht 5\' 3"  (1.6 m)   Wt 168 lb (76.2 kg)   SpO2 99%   BMI 29.76 kg/m     Wt Readings from Last 3 Encounters:  09/23/20 168 lb (76.2 kg)  11/07/13 172 lb (78 kg)  07/07/10 166 lb 4 oz (75.4 kg)     GEN: Mildly overweight. No acute distress HEENT: Normal NECK: No JVD. LYMPHATICS: No lymphadenopathy CARDIAC: No murmur. RRR no gallop, or edema. VASCULAR:  Normal Pulses. No bruits. RESPIRATORY:  Clear to auscultation without rales, wheezing or rhonchi  ABDOMEN: Soft, non-tender, non-distended, No pulsatile mass, MUSCULOSKELETAL: No deformity  SKIN: Warm and dry NEUROLOGIC:  Alert and oriented x 3 PSYCHIATRIC:  Normal affect   ASSESSMENT:    1. Chest pressure   2. Essential hypertension   3. Hyperlipidemia LDL goal <70    PLAN:    In order of problems listed above:  1. Uncertain etiology in the setting of extremely elevated LDL cholesterol over 200.  Chest discomfort has been present intermittently for the past 10 years.  Plan coronary CTA with FFR if indicated and calcium score.  Further evaluation and management will be dependent upon findings. 2. Not currently on therapy for hypertension.  Blood pressure today is mildly elevated for age risk systolic of 497 mmHg. 3. Will base lipid management upon findings from coronary CTA and calcium score.  She has tried statins in the past but has significant musculoskeletal pain.  Statins were tried after the most recent cardiac evaluation in 2010.   Medication Adjustments/Labs and Tests Ordered: Current medicines are reviewed at length with the patient today.  Concerns regarding medicines are outlined above.  Orders Placed This Encounter  Procedures  . EKG 12-Lead   No orders of the defined types were placed in this encounter.   There are no Patient Instructions on file  for this visit.   Signed, Sinclair Grooms, MD  09/23/2020 3:01 PM    [facility]

## 2020-09-23 ENCOUNTER — Other Ambulatory Visit: Payer: Self-pay

## 2020-09-23 ENCOUNTER — Ambulatory Visit: Payer: Federal, State, Local not specified - PPO | Admitting: Interventional Cardiology

## 2020-09-23 ENCOUNTER — Encounter: Payer: Self-pay | Admitting: Interventional Cardiology

## 2020-09-23 VITALS — BP 138/76 | HR 70 | Ht 63.0 in | Wt 168.0 lb

## 2020-09-23 DIAGNOSIS — E785 Hyperlipidemia, unspecified: Secondary | ICD-10-CM

## 2020-09-23 DIAGNOSIS — R072 Precordial pain: Secondary | ICD-10-CM

## 2020-09-23 DIAGNOSIS — R0789 Other chest pain: Secondary | ICD-10-CM

## 2020-09-23 DIAGNOSIS — I10 Essential (primary) hypertension: Secondary | ICD-10-CM | POA: Diagnosis not present

## 2020-09-23 MED ORDER — METOPROLOL TARTRATE 100 MG PO TABS: ORAL_TABLET | ORAL | 0 refills | Status: DC

## 2020-09-23 NOTE — Patient Instructions (Addendum)
Medication Instructions:  Your physician recommends that you continue on your current medications as directed. Please refer to the Current Medication list given to you today.  *If you need a refill on your cardiac medications before your next appointment, please call your pharmacy*   Lab Work: BMET today  If you have labs (blood work) drawn today and your tests are completely normal, you will receive your results only by: Marland Kitchen MyChart Message (if you have MyChart) OR . A paper copy in the mail If you have any lab test that is abnormal or we need to change your treatment, we will call you to review the results.   Testing/Procedures: Your physician recommends that you have a Coronary CT performed.   Follow-Up: At [facility], you and your health needs are our priority.  As part of our continuing mission to provide you with exceptional heart care, we have created designated Provider Care Teams.  These Care Teams include your primary Cardiologist (physician) and Advanced Practice Providers (APPs -  Physician Assistants and Nurse Practitioners) who all work together to provide you with the care you need, when you need it.  We recommend signing up for the patient portal called "MyChart".  Sign up information is provided on this After Visit Summary.  MyChart is used to connect with patients for Virtual Visits (Telemedicine).  Patients are able to view lab/test results, encounter notes, upcoming appointments, etc.  Non-urgent messages can be sent to your provider as well.   To learn more about what you can do with MyChart, go to [email or web address].    Your next appointment:   As needed  The format for your next appointment:   In Person  Provider:   You may see Dr. Daneen Schick or one of the following Advanced Practice Providers on your designated Care Team:    Kathyrn Drown, NP    Other Instructions  Your cardiac CT will be scheduled at one of the below locations:   [facility] [address] [place], [place] [address] [phone]  [facility] [address] [place], [place] [address] [phone]  If scheduled at [facility], please arrive at the [facility] main entrance (entrance A) of [facility] 30 minutes prior to test start time. Proceed to the [facility] Radiology Department (first floor) to check-in and test prep.  If scheduled at [facility], please arrive 15 mins early for check-in and test prep.  Please follow these instructions carefully (unless otherwise directed):  Hold all erectile dysfunction medications at least 3 days (72 hrs) prior to test.  On the Night Before the Test: . Be sure to Drink plenty of water. . Do not consume any caffeinated/decaffeinated beverages or chocolate 12 hours prior to your test. . Do not take any antihistamines 12 hours prior to your test.  On the Day of the Test: . Drink plenty of water until 1 hour prior to the test. . Do not eat any food 4 hours prior to the test. . You may take your regular medications prior to the test.  . Take metoprolol (Lopressor) two hours prior to test. . HOLD Furosemide/Hydrochlorothiazide morning of the test. . FEMALES- please wear underwire-free bra if available       After the Test: . Drink plenty of water. . After receiving IV contrast, you may experience a mild flushed feeling. This is normal. . On occasion, you may experience a mild  rash up to 24 hours after the test. This is not dangerous. If this occurs, you can take Benadryl 25 mg and increase your fluid intake. . If you experience trouble breathing, this can be serious. If it is severe call 911 IMMEDIATELY. If it is mild, please call our office. . If you take any of these medications: Glipizide/Metformin, Avandament, Glucavance, please do not take 48 hours after completing test unless otherwise  instructed.   Once we have confirmed authorization from your insurance company, we will call you to set up a date and time for your test. Based on how quickly your insurance processes prior authorizations requests, please allow up to 4 weeks to be contacted for scheduling your Cardiac CT appointment. Be advised that routine Cardiac CT appointments could be scheduled as many as 8 weeks after your provider has ordered it.  For non-scheduling related questions, please contact the cardiac imaging nurse navigator should you have any questions/concerns: Marchia Bond, Cardiac Imaging Nurse Navigator Gordy Clement, Cardiac Imaging Nurse Navigator [facility] Direct Office Dial: [phone]   For scheduling needs, including cancellations and rescheduling, please call [place], [phone].

## 2020-09-24 LAB — BASIC METABOLIC PANEL
BUN/Creatinine Ratio: 16 (ref 12–28)
BUN: 13 mg/dL (ref 8–27)
CO2: 22 mmol/L (ref 20–29)
Calcium: 10.5 mg/dL — ABNORMAL HIGH (ref 8.7–10.3)
Chloride: 103 mmol/L (ref 96–106)
Creatinine, Ser: 0.79 mg/dL (ref 0.57–1.00)
Glucose: 95 mg/dL (ref 65–99)
Potassium: 4.4 mmol/L (ref 3.5–5.2)
Sodium: 140 mmol/L (ref 134–144)
eGFR: 82 mL/min/{1.73_m2} (ref >59)

## 2020-09-28 ENCOUNTER — Telehealth (HOSPITAL_COMMUNITY): Payer: Self-pay | Admitting: Emergency Medicine

## 2020-09-28 NOTE — Telephone Encounter (Signed)
Reaching out to patient to offer assistance regarding upcoming cardiac imaging study; pt verbalizes understanding of appt date/time, parking situation and where to check in, pre-test NPO status and medications ordered, and verified current allergies; name and call back number provided for further questions should they arise Carsin Randazzo RN Navigator Cardiac Imaging [facility] [phone] office [phone] cell   100mg metoprolol tartrate  

## 2020-09-28 NOTE — Telephone Encounter (Signed)
Attempted to call patient regarding upcoming cardiac CT appointment. °Left message on voicemail with name and callback number °Vondell Sowell RN Navigator Cardiac Imaging °[facility] °[phone] Office °[phone] Cell ° °

## 2020-09-29 ENCOUNTER — Encounter (HOSPITAL_BASED_OUTPATIENT_CLINIC_OR_DEPARTMENT_OTHER): Payer: Self-pay

## 2020-09-29 ENCOUNTER — Ambulatory Visit (HOSPITAL_BASED_OUTPATIENT_CLINIC_OR_DEPARTMENT_OTHER)
Admission: RE | Admit: 2020-09-29 | Discharge: 2020-09-29 | Disposition: A | Discharge status: Home / Self Care | Payer: Federal, State, Local not specified - PPO | Source: Ambulatory Visit | Attending: Interventional Cardiology | Admitting: Interventional Cardiology

## 2020-09-29 ENCOUNTER — Other Ambulatory Visit: Payer: Self-pay

## 2020-09-29 DIAGNOSIS — R072 Precordial pain: Secondary | ICD-10-CM

## 2020-09-29 MED ORDER — NITROGLYCERIN 0.4 MG SL SUBL
0.8000 mg | SUBLINGUAL_TABLET | Freq: Once | SUBLINGUAL | Status: AC
  Administered 2020-09-29: 0.8 mg via SUBLINGUAL

## 2020-09-29 MED ORDER — IOHEXOL 350 MG/ML SOLN
95.0000 mL | Freq: Once | INTRAVENOUS | Status: AC | PRN
  Administered 2020-09-29: 95 mL via INTRAVENOUS

## 2020-09-29 MED ORDER — METOPROLOL TARTRATE 5 MG/5ML IV SOLN
5.0000 mg | INTRAVENOUS | Status: DC | PRN
  Administered 2020-09-29: 10 mg via INTRAVENOUS

## 2020-09-29 NOTE — Progress Notes (Signed)
Pt tolerated exam without incident; pt denies lightheadedness or dizziness; pt ambulatory to lobby steady gait noted  

## 2020-11-23 ENCOUNTER — Ambulatory Visit: Payer: Self-pay | Admitting: Surgery

## 2020-11-23 NOTE — Pre-Procedure Instructions (Signed)
Surgical Instructions    Your procedure is scheduled on Monday June 13th.  Report to [facility] Main Entrance "A" at 05:30 A.M., then check in with the Admitting office.  Call this number if you have problems the morning of surgery:  [phone]   If you have any questions prior to your surgery date call [phone]: Open Monday-Friday 8am-4pm    Remember:  Do not eat or drink after midnight the night before your surgery    Take these medicines the morning of surgery with A SIP OF WATER   levothyroxine (SYNTHROID, LEVOTHROID)     As of today, STOP taking any Aspirin (unless otherwise instructed by your surgeon) Aleve, Naproxen, Ibuprofen, Motrin, Advil, Goody's, BC's, all herbal medications, fish oil, and all vitamins.          Do not wear jewelry or makeup Do not wear lotions, powders, perfumes/colognes, or deodorant. Do not shave 48 hours prior to surgery.   Do not bring valuables to the hospital. DO Not wear nail polish, gel polish, artificial nails, or any other type of covering on natural nails including finger and toenails. If patients have artificial nails, gel coating, etc. that need to be removed by a nail salon please have this removed prior to surgery or surgery may need to be canceled/delayed if the surgeon/ anesthesia feels like the patient is unable to be adequately monitored.             [facility] is not responsible for any belongings or valuables.  Do NOT Smoke (Tobacco/Vaping) or drink Alcohol 24 hours prior to your procedure If you use a CPAP at night, you may bring all equipment for your overnight stay.   Contacts, glasses, dentures or bridgework may not be worn into surgery, please bring cases for these belongings   For patients admitted to the hospital, discharge time will be determined by your treatment team.   Patients discharged the day of surgery will not be allowed to drive home, and someone needs to stay with them for 24 hours.    Special  instructions:    Oral Hygiene is also important to reduce your risk of infection.  Remember - BRUSH YOUR TEETH THE MORNING OF SURGERY WITH YOUR REGULAR TOOTHPASTE   [place]- Preparing For Surgery  Before surgery, you can play an important role. Because skin is not sterile, your skin needs to be as free of germs as possible. You can reduce the number of germs on your skin by washing with CHG (chlorahexidine gluconate) Soap before surgery.  CHG is an antiseptic cleaner which kills germs and bonds with the skin to continue killing germs even after washing.     Please do not use if you have an allergy to CHG or antibacterial soaps. If your skin becomes reddened/irritated stop using the CHG.  Do not shave (including legs and underarms) for at least 48 hours prior to first CHG shower. It is OK to shave your face.  Please follow these instructions carefully.    1.  Shower the NIGHT BEFORE SURGERY and the MORNING OF SURGERY with CHG Soap.   If you chose to wash your hair, wash your hair first as usual with your normal shampoo. After you shampoo, rinse your hair and body thoroughly to remove the shampoo.  Then [employer] and genitals (private parts) with your normal soap and rinse thoroughly to remove soap.  2. After that Use CHG Soap as you would any other liquid soap. You can apply  CHG directly to the skin and wash gently with a scrungie or a clean washcloth.   3. Apply the CHG Soap to your body ONLY FROM THE NECK DOWN.  Do not use on open wounds or open sores. Avoid contact with your eyes, ears, mouth and genitals (private parts). Wash Face and genitals (private parts)  with your normal soap.   4. Wash thoroughly, paying special attention to the area where your surgery will be performed.  5. Thoroughly rinse your body with warm water from the neck down.  6. DO NOT shower/wash with your normal soap after using and rinsing off the CHG Soap.  7. Pat yourself dry with a CLEAN TOWEL.  8. Wear  CLEAN PAJAMAS to bed the night before surgery  9. Place CLEAN SHEETS on your bed the night before your surgery  10. DO NOT SLEEP WITH PETS.   Day of Surgery:  Take a shower with CHG soap. Wear Clean/Comfortable clothing the morning of surgery Do not apply any deodorants/lotions.   Remember to brush your teeth WITH YOUR REGULAR TOOTHPASTE.   Please read over the following fact sheets that you were given.

## 2020-11-24 ENCOUNTER — Other Ambulatory Visit: Payer: Self-pay

## 2020-11-24 ENCOUNTER — Encounter (HOSPITAL_COMMUNITY)
Admission: RE | Admit: 2020-11-24 | Discharge: 2020-11-24 | Disposition: A | Discharge status: Home / Self Care | Payer: Federal, State, Local not specified - PPO | Source: Ambulatory Visit | Attending: Surgery | Admitting: Surgery

## 2020-11-24 ENCOUNTER — Encounter (HOSPITAL_COMMUNITY): Payer: Self-pay

## 2020-11-24 DIAGNOSIS — R079 Chest pain, unspecified: Secondary | ICD-10-CM | POA: Insufficient documentation

## 2020-11-24 DIAGNOSIS — I2510 Atherosclerotic heart disease of native coronary artery without angina pectoris: Secondary | ICD-10-CM | POA: Diagnosis not present

## 2020-11-24 DIAGNOSIS — Z01812 Encounter for preprocedural laboratory examination: Secondary | ICD-10-CM | POA: Diagnosis not present

## 2020-11-24 HISTORY — DX: Myoneural disorder, unspecified: G70.9

## 2020-11-24 HISTORY — DX: Pneumonia, unspecified organism: J18.9

## 2020-11-24 HISTORY — DX: Inflammatory liver disease, unspecified: K75.9

## 2020-11-24 HISTORY — DX: Essential (primary) hypertension: I10

## 2020-11-24 HISTORY — DX: Cardiac murmur, unspecified: R01.1

## 2020-11-24 HISTORY — DX: Unspecified cataract: H26.9

## 2020-11-24 LAB — CBC
HCT: 46.3 % — ABNORMAL HIGH (ref 36.0–46.0)
Hemoglobin: 15.2 g/dL — ABNORMAL HIGH (ref 12.0–15.0)
MCH: 31.5 pg (ref 26.0–34.0)
MCHC: 32.8 g/dL (ref 30.0–36.0)
MCV: 95.9 fL (ref 80.0–100.0)
Platelets: 272 10*3/uL (ref 150–400)
RBC: 4.83 MIL/uL (ref 3.87–5.11)
RDW: 12.7 % (ref 11.5–15.5)
WBC: 5.4 10*3/uL (ref 4.0–10.5)
nRBC: 0 % (ref 0.0–0.2)

## 2020-11-24 LAB — COMPREHENSIVE METABOLIC PANEL
ALT: 27 U/L (ref 0–44)
AST: 23 U/L (ref 15–41)
Albumin: 4 g/dL (ref 3.5–5.0)
Alkaline Phosphatase: 88 U/L (ref 38–126)
Anion gap: 6 (ref 5–15)
BUN: 14 mg/dL (ref 8–23)
CO2: 27 mmol/L (ref 22–32)
Calcium: 9.9 mg/dL (ref 8.9–10.3)
Chloride: 107 mmol/L (ref 98–111)
Creatinine, Ser: 0.88 mg/dL (ref 0.44–1.00)
GFR, Estimated: >60 mL/min (ref >60)
Glucose, Bld: 94 mg/dL (ref 70–99)
Potassium: 4.4 mmol/L (ref 3.5–5.1)
Sodium: 140 mmol/L (ref 135–145)
Total Bilirubin: 0.6 mg/dL (ref 0.3–1.2)
Total Protein: 6.8 g/dL (ref 6.5–8.1)

## 2020-11-24 NOTE — Progress Notes (Signed)
PCP - Dr. London Pepper Cardiologist - Dr. Daneen Schick  PPM/ICD - denies  Chest x-ray - 7/37/10 EKG - 09/23/20 Stress Test - 03/26/2010 ECHO - 2010 Cardiac Cath - 2006  Sleep Study - denies  As of today, STOP taking any Aspirin (unless otherwise instructed by your surgeon) Aleve, Naproxen, Ibuprofen, Motrin, Advil, Goody's, BC's, all herbal medications, fish oil, and all vitamins.  ERAS Protcol - no  COVID TEST- not needed, ambulatory surgery   Anesthesia review: yes- records requested from Dr. Marlou Porch regarding stress test, echo, and cardiac cath from 2006-2010  Patient denies shortness of breath, fever, cough and chest pain at PAT appointment   All instructions explained to the patient, with a verbal understanding of the material. Patient agrees to go over the instructions while at home for a better understanding. Patient also instructed to self quarantine after being tested for COVID-19. The opportunity to ask questions was provided.

## 2020-11-25 NOTE — Progress Notes (Signed)
Anesthesia Chart Review:  Pt recently evaluated by cardiologist Dr. Tamala Julian on 09/23/20 at request of PCP for reports of chest pain. It's noted that she has chest discomfort that is not always precipitated by physical activity that can last for hours. Stress can precipitate the discomfort. She has had these symptoms for a number of years. In 2006 she underwent coronary angiography that was unremarkable.  In 2010 she had work-up by Dr. Marlou Porch that included an exercise treadmill test and echocardiogram that were unremarkable. Dr. Tamala Julian ordered coronary CTA for evaluation. Scan done 09/29/20 showed minimal nonobstructive CAD. Dr. Tamala Julian commented on result stating, "Let the patient know the coronary calcium score is 1.  No significant blockages are noted.  Chest pain is not related to surface artery blockage.  No further evaluation at this time."  Preop labs reviewed, unremarkable.   EKG 09/23/20: Sinus rhythm. Rate 70.  Coronary CTA 09/29/20: IMPRESSION: 1.  Minimal nonobstructive CAD, CADRADS = 1.   2. Coronary calcium score of 1. This was 7th percentile for age and sex matched control.   3. Normal coronary origin with right dominance.    Wynonia Musty [facility] Short Stay Center/Anesthesiology Phone [phone] 11/25/2020 8:46 AM

## 2020-11-25 NOTE — Anesthesia Preprocedure Evaluation (Addendum)
Anesthesia Evaluation  Patient identified by MRN, date of birth, ID band Patient awake    Reviewed: Allergy & Precautions, NPO status , Patient's Chart, lab work & pertinent test results, reviewed documented beta blocker date and time   History of Anesthesia Complications (+) PONV and history of anesthetic complications (thinks she did well with GA for cysto in 2015- had scop patch + inhalational)  Airway Mallampati: II  TM Distance: >3 FB Neck ROM: Full    Dental no notable dental hx. (+) Teeth Intact, Dental Advisory Given   Pulmonary neg pulmonary ROS,    Pulmonary exam normal breath sounds clear to auscultation       Cardiovascular hypertension (BP 163/70- not on any BP meds), Normal cardiovascular exam Rhythm:Regular Rate:Normal   recently evaluated by cardiologist Dr. Tamala Julian on 09/23/20 at request of PCP for reports of chest pain. It's noted that she has chest discomfort that is not always precipitated by physical activity that can last for hours. Stress can precipitate the discomfort. She has had these symptoms for a number of years.In 2006 she underwent coronary angiography that was unremarkable. In 2010 she had work-up by Dr. Marlou Porch that included an exercise treadmill test and echocardiogram that were unremarkable. Dr. Tamala Julian ordered coronary CTA for evaluation. Scan done 09/29/20 showed minimal nonobstructive CAD. Dr. Tamala Julian commented on result stating, "Let the patient know the coronary calcium score is 1. No significant blockages are noted. Chest pain is not related to surface artery blockage. No further evaluation at this time."   Neuro/Psych negative neurological ROS  negative psych ROS   GI/Hepatic negative GI ROS, Biliary dyskinesia    Endo/Other  Hypothyroidism   Renal/GU negative Renal ROS Bladder dysfunction      Musculoskeletal  (+) Arthritis , Osteoarthritis,    Abdominal   Peds  Hematology negative  hematology ROS (+) hct 46.3   Anesthesia Other Findings   Reproductive/Obstetrics negative OB ROS                          Anesthesia Physical Anesthesia Plan  ASA: 2  Anesthesia Plan: General   Post-op Pain Management:    Induction: Intravenous  PONV Risk Score and Plan: 4 or greater and Ondansetron, Aprepitant, Dexamethasone, Midazolam, Scopolamine patch - Pre-op, Treatment may vary due to age or medical condition, Diphenhydramine and Metaclopromide  Airway Management Planned: Oral ETT  Additional Equipment: None  Intra-op Plan:   Post-operative Plan: Extubation in OR  Informed Consent: I have reviewed the patients History and Physical, chart, labs and discussed the procedure including the risks, benefits and alternatives for the proposed anesthesia with the patient or authorized representative who has indicated his/her understanding and acceptance.     Dental advisory given  Plan Discussed with: CRNA  Anesthesia Plan Comments: ( )    Anesthesia Quick Evaluation

## 2020-11-29 ENCOUNTER — Encounter (HOSPITAL_COMMUNITY): Payer: Self-pay | Admitting: Surgery

## 2020-11-29 ENCOUNTER — Ambulatory Visit (HOSPITAL_COMMUNITY): Payer: Federal, State, Local not specified - PPO | Admitting: Physician Assistant

## 2020-11-29 ENCOUNTER — Encounter (HOSPITAL_COMMUNITY)
Admission: RE | Disposition: A | Discharge status: Home / Self Care | Payer: Self-pay | Source: Home / Self Care | Attending: Surgery

## 2020-11-29 ENCOUNTER — Ambulatory Visit (HOSPITAL_COMMUNITY): Payer: Federal, State, Local not specified - PPO | Admitting: Anesthesiology

## 2020-11-29 ENCOUNTER — Ambulatory Visit (HOSPITAL_COMMUNITY)
Admission: RE | Admit: 2020-11-29 | Discharge: 2020-11-29 | Disposition: A | Discharge status: Home / Self Care | Payer: Federal, State, Local not specified - PPO | Attending: Surgery | Admitting: Surgery

## 2020-11-29 DIAGNOSIS — K828 Other specified diseases of gallbladder: Secondary | ICD-10-CM | POA: Insufficient documentation

## 2020-11-29 DIAGNOSIS — Z79899 Other long term (current) drug therapy: Secondary | ICD-10-CM | POA: Insufficient documentation

## 2020-11-29 DIAGNOSIS — K811 Chronic cholecystitis: Secondary | ICD-10-CM | POA: Insufficient documentation

## 2020-11-29 DIAGNOSIS — Z79890 Hormone replacement therapy (postmenopausal): Secondary | ICD-10-CM | POA: Insufficient documentation

## 2020-11-29 DIAGNOSIS — Z880 Allergy status to penicillin: Secondary | ICD-10-CM | POA: Insufficient documentation

## 2020-11-29 HISTORY — PX: CHOLECYSTECTOMY: SHX55

## 2020-11-29 SURGERY — LAPAROSCOPIC CHOLECYSTECTOMY
Anesthesia: General | Site: Abdomen

## 2020-11-29 MED ORDER — MEPERIDINE HCL 25 MG/ML IJ SOLN: 6.2500 mg | INTRAMUSCULAR | Status: DC | PRN

## 2020-11-29 MED ORDER — PHENYLEPHRINE 40 MCG/ML (10ML) SYRINGE FOR IV PUSH (FOR BLOOD PRESSURE SUPPORT)
PREFILLED_SYRINGE | INTRAVENOUS | Status: AC
  Filled 2020-11-29: qty 10

## 2020-11-29 MED ORDER — GABAPENTIN 300 MG PO CAPS: 300.0000 mg | ORAL_CAPSULE | ORAL | Status: DC

## 2020-11-29 MED ORDER — ROCURONIUM BROMIDE 100 MG/10ML IV SOLN
INTRAVENOUS | Status: DC | PRN
  Administered 2020-11-29: 80 mg via INTRAVENOUS

## 2020-11-29 MED ORDER — VANCOMYCIN HCL IN DEXTROSE 1-5 GM/200ML-% IV SOLN: 1000.0000 mg | INTRAVENOUS | Status: AC

## 2020-11-29 MED ORDER — HYDROCODONE-ACETAMINOPHEN 5-325 MG PO TABS: 1.0000 | ORAL_TABLET | Freq: Four times a day (QID) | ORAL | 0 refills | Status: DC | PRN

## 2020-11-29 MED ORDER — DEXAMETHASONE SODIUM PHOSPHATE 10 MG/ML IJ SOLN
INTRAMUSCULAR | Status: DC | PRN
  Administered 2020-11-29: 10 mg via INTRAVENOUS

## 2020-11-29 MED ORDER — ACETAMINOPHEN 10 MG/ML IV SOLN: 1000.0000 mg | Freq: Once | INTRAVENOUS | Status: DC

## 2020-11-29 MED ORDER — SUGAMMADEX SODIUM 500 MG/5ML IV SOLN
INTRAVENOUS | Status: DC | PRN
  Administered 2020-11-29: 200 mg via INTRAVENOUS

## 2020-11-29 MED ORDER — ONDANSETRON HCL 4 MG/2ML IJ SOLN
INTRAMUSCULAR | Status: AC
  Filled 2020-11-29: qty 2

## 2020-11-29 MED ORDER — HYDROMORPHONE HCL 1 MG/ML IJ SOLN
0.2500 mg | INTRAMUSCULAR | Status: DC | PRN
  Administered 2020-11-29: 0.5 mg via INTRAVENOUS

## 2020-11-29 MED ORDER — TRAMADOL HCL 50 MG PO TABS
ORAL_TABLET | ORAL | Status: AC
  Administered 2020-11-29: 50 mg via ORAL
  Filled 2020-11-29: qty 1

## 2020-11-29 MED ORDER — ONDANSETRON HCL 4 MG/2ML IJ SOLN
INTRAMUSCULAR | Status: DC | PRN
  Administered 2020-11-29: 4 mg via INTRAVENOUS

## 2020-11-29 MED ORDER — AMISULPRIDE (ANTIEMETIC) 5 MG/2ML IV SOLN: 10.0000 mg | Freq: Once | INTRAVENOUS | Status: DC | PRN

## 2020-11-29 MED ORDER — 0.9 % SODIUM CHLORIDE (POUR BTL) OPTIME
TOPICAL | Status: DC | PRN
  Administered 2020-11-29: 1000 mL

## 2020-11-29 MED ORDER — PROPOFOL 10 MG/ML IV BOLUS
INTRAVENOUS | Status: DC | PRN
  Administered 2020-11-29: 200 mg via INTRAVENOUS

## 2020-11-29 MED ORDER — METOCLOPRAMIDE HCL 5 MG/ML IJ SOLN
INTRAMUSCULAR | Status: AC
  Filled 2020-11-29: qty 2

## 2020-11-29 MED ORDER — ACETAMINOPHEN 10 MG/ML IV SOLN
INTRAVENOUS | Status: AC
  Filled 2020-11-29: qty 100

## 2020-11-29 MED ORDER — DIPHENHYDRAMINE HCL 50 MG/ML IJ SOLN
INTRAMUSCULAR | Status: AC
  Filled 2020-11-29: qty 1

## 2020-11-29 MED ORDER — BUPIVACAINE-EPINEPHRINE (PF) 0.25% -1:200000 IJ SOLN
INTRAMUSCULAR | Status: AC
  Filled 2020-11-29: qty 30

## 2020-11-29 MED ORDER — ACETAMINOPHEN 500 MG PO TABS: 1000.0000 mg | ORAL_TABLET | ORAL | Status: AC

## 2020-11-29 MED ORDER — LIDOCAINE HCL (PF) 2 % IJ SOLN
INTRAMUSCULAR | Status: AC
  Filled 2020-11-29: qty 5

## 2020-11-29 MED ORDER — DIPHENHYDRAMINE HCL 50 MG/ML IJ SOLN
INTRAMUSCULAR | Status: DC | PRN
  Administered 2020-11-29: 6.25 mg via INTRAVENOUS

## 2020-11-29 MED ORDER — GABAPENTIN 300 MG PO CAPS
ORAL_CAPSULE | ORAL | Status: AC
  Filled 2020-11-29: qty 1

## 2020-11-29 MED ORDER — DEXAMETHASONE SODIUM PHOSPHATE 10 MG/ML IJ SOLN
INTRAMUSCULAR | Status: AC
  Filled 2020-11-29: qty 1

## 2020-11-29 MED ORDER — MIDAZOLAM HCL 2 MG/2ML IJ SOLN
INTRAMUSCULAR | Status: AC
  Filled 2020-11-29: qty 2

## 2020-11-29 MED ORDER — VANCOMYCIN HCL IN DEXTROSE 1-5 GM/200ML-% IV SOLN
INTRAVENOUS | Status: AC
  Administered 2020-11-29: 1000 mg via INTRAVENOUS
  Filled 2020-11-29: qty 200

## 2020-11-29 MED ORDER — TRAMADOL HCL 50 MG PO TABS: 50.0000 mg | ORAL_TABLET | Freq: Once | ORAL | Status: AC

## 2020-11-29 MED ORDER — MIDAZOLAM HCL 5 MG/5ML IJ SOLN
INTRAMUSCULAR | Status: DC | PRN
  Administered 2020-11-29: 2 mg via INTRAVENOUS

## 2020-11-29 MED ORDER — METOCLOPRAMIDE HCL 5 MG/ML IJ SOLN
INTRAMUSCULAR | Status: DC | PRN
  Administered 2020-11-29 (×2): 5 mg via INTRAVENOUS

## 2020-11-29 MED ORDER — ACETAMINOPHEN 500 MG PO TABS
ORAL_TABLET | ORAL | Status: AC
  Administered 2020-11-29: 1000 mg via ORAL
  Filled 2020-11-29: qty 2

## 2020-11-29 MED ORDER — ROCURONIUM BROMIDE 10 MG/ML (PF) SYRINGE
PREFILLED_SYRINGE | INTRAVENOUS | Status: AC
  Filled 2020-11-29: qty 10

## 2020-11-29 MED ORDER — SODIUM CHLORIDE 0.9 % IR SOLN
Status: DC | PRN
  Administered 2020-11-29: 1000 mL

## 2020-11-29 MED ORDER — SCOPOLAMINE 1 MG/3DAYS TD PT72
1.0000 | MEDICATED_PATCH | TRANSDERMAL | Status: DC
  Administered 2020-11-29: 1.5 mg via TRANSDERMAL
  Filled 2020-11-29: qty 1

## 2020-11-29 MED ORDER — FENTANYL CITRATE (PF) 100 MCG/2ML IJ SOLN
INTRAMUSCULAR | Status: DC | PRN
  Administered 2020-11-29 (×2): 100 ug via INTRAVENOUS
  Administered 2020-11-29: 50 ug via INTRAVENOUS

## 2020-11-29 MED ORDER — APREPITANT 40 MG PO CAPS
40.0000 mg | ORAL_CAPSULE | Freq: Once | ORAL | Status: AC
  Administered 2020-11-29: 40 mg via ORAL
  Filled 2020-11-29: qty 1

## 2020-11-29 MED ORDER — PROMETHAZINE HCL 25 MG/ML IJ SOLN: 6.2500 mg | INTRAMUSCULAR | Status: DC | PRN

## 2020-11-29 MED ORDER — LACTATED RINGERS IV SOLN: INTRAVENOUS | Status: DC

## 2020-11-29 MED ORDER — PHENYLEPHRINE 40 MCG/ML (10ML) SYRINGE FOR IV PUSH (FOR BLOOD PRESSURE SUPPORT)
PREFILLED_SYRINGE | INTRAVENOUS | Status: DC | PRN
  Administered 2020-11-29: 100 ug via INTRAVENOUS
  Administered 2020-11-29: 120 ug via INTRAVENOUS
  Administered 2020-11-29: 80 ug via INTRAVENOUS
  Administered 2020-11-29: 100 ug via INTRAVENOUS

## 2020-11-29 MED ORDER — FENTANYL CITRATE (PF) 250 MCG/5ML IJ SOLN
INTRAMUSCULAR | Status: AC
  Filled 2020-11-29: qty 5

## 2020-11-29 MED ORDER — PROPOFOL 10 MG/ML IV BOLUS
INTRAVENOUS | Status: AC
  Filled 2020-11-29: qty 40

## 2020-11-29 MED ORDER — HYDROMORPHONE HCL 1 MG/ML IJ SOLN
INTRAMUSCULAR | Status: AC
  Administered 2020-11-29: 0.5 mg via INTRAVENOUS
  Filled 2020-11-29: qty 1

## 2020-11-29 MED ORDER — BUPIVACAINE-EPINEPHRINE 0.25% -1:200000 IJ SOLN
INTRAMUSCULAR | Status: DC | PRN
  Administered 2020-11-29 (×2): 10 mL

## 2020-11-29 SURGICAL SUPPLY — 38 items
APPLIER CLIP 5 13 M/L LIGAMAX5 (MISCELLANEOUS) ×2
BLADE CLIPPER SURG (BLADE) IMPLANT
CANISTER SUCT 3000ML PPV (MISCELLANEOUS) ×2 IMPLANT
CHLORAPREP W/TINT 26 (MISCELLANEOUS) ×2 IMPLANT
CLIP APPLIE 5 13 M/L LIGAMAX5 (MISCELLANEOUS) ×1 IMPLANT
COVER SURGICAL LIGHT HANDLE (MISCELLANEOUS) ×2 IMPLANT
COVER WAND RF STERILE (DRAPES) ×2 IMPLANT
DERMABOND ADVANCED (GAUZE/BANDAGES/DRESSINGS) ×1
DERMABOND ADVANCED .7 DNX12 (GAUZE/BANDAGES/DRESSINGS) ×1 IMPLANT
ELECT REM PT RETURN 9FT ADLT (ELECTROSURGICAL) ×2
ELECTRODE REM PT RTRN 9FT ADLT (ELECTROSURGICAL) ×1 IMPLANT
GLOVE BIOGEL PI IND STRL 6 (GLOVE) ×1 IMPLANT
GLOVE BIOGEL PI INDICATOR 6 (GLOVE) ×1
GLOVE BIOGEL PI MICRO 5.5 (GLOVE) ×1
GLOVE BIOGEL PI MICRO STRL 5.5 (GLOVE) ×1 IMPLANT
GOWN STRL REUS W/ TWL LRG LVL3 (GOWN DISPOSABLE) ×3 IMPLANT
GOWN STRL REUS W/TWL LRG LVL3 (GOWN DISPOSABLE) ×3
KIT BASIN OR (CUSTOM PROCEDURE TRAY) ×2 IMPLANT
KIT TURNOVER KIT B (KITS) ×2 IMPLANT
L-HOOK LAP DISP 36CM (ELECTROSURGICAL) ×2
LHOOK LAP DISP 36CM (ELECTROSURGICAL) ×1 IMPLANT
NS IRRIG 1000ML POUR BTL (IV SOLUTION) ×2 IMPLANT
PAD ARMBOARD 7.5X6 YLW CONV (MISCELLANEOUS) ×2 IMPLANT
PENCIL BUTTON HOLSTER BLD 10FT (ELECTRODE) ×2 IMPLANT
POUCH SPECIMEN RETRIEVAL 10MM (ENDOMECHANICALS) ×2 IMPLANT
SCISSORS LAP 5X35 DISP (ENDOMECHANICALS) ×2 IMPLANT
SET IRRIG TUBING LAPAROSCOPIC (IRRIGATION / IRRIGATOR) ×2 IMPLANT
SET TUBE SMOKE EVAC HIGH FLOW (TUBING) ×2 IMPLANT
SLEEVE ENDOPATH XCEL 5M (ENDOMECHANICALS) ×4 IMPLANT
SPECIMEN JAR SMALL (MISCELLANEOUS) ×2 IMPLANT
SUT MNCRL AB 4-0 PS2 18 (SUTURE) ×4 IMPLANT
SUT VICRYL 0 UR6 27IN ABS (SUTURE) ×2 IMPLANT
TOWEL GREEN STERILE (TOWEL DISPOSABLE) ×2 IMPLANT
TOWEL GREEN STERILE FF (TOWEL DISPOSABLE) ×2 IMPLANT
TRAY LAPAROSCOPIC MC (CUSTOM PROCEDURE TRAY) ×2 IMPLANT
TROCAR XCEL BLUNT TIP 100MML (ENDOMECHANICALS) ×2 IMPLANT
TROCAR XCEL NON-BLD 5MMX100MML (ENDOMECHANICALS) ×2 IMPLANT
WATER STERILE IRR 1000ML POUR (IV SOLUTION) ×2 IMPLANT

## 2020-11-29 NOTE — Op Note (Signed)
Date: 11/29/20  Patient: Desiree Bryant MRN: [id number]  Preoperative Diagnosis: Biliary dyskinesia Postoperative Diagnosis: Same  Procedure: Laparoscopic cholecystectomy  Surgeon: Michaelle Birks, MD  EBL: Minimal  Anesthesia: General  Specimens: Gallbladder  Indications: Desiree Bryant is a 65 yo female who has been having daily epigastric abdominal pain. RUQ [place] did not show any gallstones, but a HIDA showed a reduced gallbladder EF of 15%. After a discussion of the risks and benefits of surgery, she agreed to proceed with cholecystectomy.  Findings: Gallbladder adhesions consistent with chronic cholecystitis. Multiple small subcentimeter benign liver cysts near the gallbladder fossa.  Procedure details: Informed consent was obtained in the preoperative area prior to the procedure. The patient was brought to the operating room and placed on the table in the supine position. General anesthesia was induced and appropriate lines and drains were placed for intraoperative monitoring. Perioperative antibiotics were administered per SCIP guidelines. The abdomen was prepped and draped in the usual sterile fashion. A pre-procedure timeout was taken verifying patient identity, surgical site and procedure to be performed.  A small infraumbilical skin incision was made, the subcutaneous tissue was divided with cautery, and the umbilical stalk was grasped and elevated. The fascia was incised and the peritoneal cavity was directly visualized. A 22mm Hassan trocar was placed. The peritoneal cavity was inspected with no evidence of visceral or vascular injury. Three 53mm ports were placed in the right subcostal margin, all under direct visualization. The fundus of the gallbladder was grasped and retracted cephalad. There were omental adhesions to the gallbladder which were carefully taken down with cautery. The infundibulum was retracted laterally. The cystic triangle was dissected out using cautery and blunt  dissection, and the critical view of safety was obtained. The cystic duct and cystic artery were clipped and ligated, leaving two clips behind on the cystic duct stump. The gallbladder was taken off the liver using cautery. The specimen was placed in an endocatch bag and removed. The surgical site was irrigated with saline until the effluent was clear. Hemostasis was achieved in the gallbladder fossa using cautery. The cystic duct and artery stumps were visually inspected and there was no evidence of bile leak or bleeding. The ports were removed under direct visualization and the abdomen was desufflated. The umbilical port site fascia was closed with a 0 vicryl suture. The skin at all port sites was closed with 4-0 monocryl subcuticular suture. Dermabond was applied.  The patient tolerated the procedure with no apparent complications. All counts were correct x2 at the end of the procedure. The patient was extubated and taken to PACU in stable condition.  Michaelle Birks, MD 11/29/20 8:33 AM

## 2020-11-29 NOTE — Discharge Instructions (Addendum)
[facility] DISCHARGE INSTRUCTIONS  Activity No heavy lifting greater than 10 pounds for 4 weeks after surgery. Ok to shower in 24 hours after surgery, but do not bathe or submerge incisions underwater. Do not drive while taking narcotic pain medication.  Wound Care Your incisions are covered with skin glue called Dermabond. This will peel off on its own over time. You may shower and allow warm soapy water to run over your incisions in 24 hours. Gently pat dry. Do not submerge your incision underwater. Monitor your incision for any new redness, tenderness, or drainage.  When to Call us: Fever greater than 100.5 New redness, drainage, or swelling at incision site Severe pain, nausea, or vomiting Jaundice (yellowing of the whites of the eyes or skin)  Follow-up You have an appointment scheduled with Dr. Zenia Resides on December 15, 2020 at 10:30am. This will be at the [facility] Surgery office at [address] [place], [place], [place]. Please arrive at least 15 minutes prior to your scheduled appointment time.  For questions or concerns, please call the office at [phone].

## 2020-11-29 NOTE — Anesthesia Procedure Notes (Signed)
Procedure Name: Intubation Date/Time: 11/29/2020 8:06 AM Performed by: Jonna Munro, CRNA Pre-anesthesia Checklist: Patient identified, Patient being monitored, Timeout performed, Emergency Drugs available and Suction available Patient Re-evaluated:Patient Re-evaluated prior to induction Oxygen Delivery Method: Circle System Utilized Preoxygenation: Pre-oxygenation with 100% oxygen Induction Type: IV induction Ventilation: Mask ventilation without difficulty Laryngoscope Size: Mac and 3 Grade View: Grade II Tube type: Oral Tube size: 7.0 mm Number of attempts: 1 Airway Equipment and Method: stylet Placement Confirmation: ETT inserted through vocal cords under direct vision, positive ETCO2 and breath sounds checked- equal and bilateral Secured at: 21 cm Tube secured with: Tape Dental Injury: Teeth and Oropharynx as per pre-operative assessment  Comments: Anterior airway, grade 2b view with DL- recc miller or glide for subsequent intubations

## 2020-11-29 NOTE — Transfer of Care (Signed)
Immediate Anesthesia Transfer of Care Note  Patient: Desiree Bryant  Procedure(s) Performed: LAPAROSCOPIC CHOLECYSTECTOMY (Abdomen)  Patient Location: PACU  Anesthesia Type:General  Level of Consciousness: awake, alert , oriented and patient cooperative  Airway & Oxygen Therapy: Patient Spontanous Breathing and Patient connected to face mask oxygen  Post-op Assessment: Report given to RN, Post -op Vital signs reviewed and stable and Patient moving all extremities X 4  Post vital signs: Reviewed and stable  Last Vitals:  Vitals Value Taken Time  BP 173/80 11/29/20 0847  Temp    Pulse 95 11/29/20 0849  Resp 15 11/29/20 0849  SpO2 95 % 11/29/20 0849  Vitals shown include unvalidated device data.  Last Pain:  Vitals:   11/29/20 0601  TempSrc:   PainSc: 0-No pain         Complications: No notable events documented.

## 2020-11-29 NOTE — H&P (Signed)
Desiree Bryant is an 65 y.o. female.   Chief Complaint: abdominal pain HPI:  Desiree Bryant is a 65 yo female who was referred for gallbladder evaluation. She reports that she has frequent abdominal discomfort that occurs daily. It is not always associated with eating, but she avoids fatty and greasy foods. She specifically states it is more discomfort and pain. She also often has nausea. She had a RUQ [place] on 1/19 that did not show any gallstones or pericholecystic fluid. She then had a HIDA scan on 3/10 that showed a reduced gallbladder EF of 15%. She was referred to me to discuss cholecystectomy. She denies jaundice and LFTs were normal. She had a recent cardiology workup for evaluation of chest pain and had a CT coronary, which showed minimal nonobstructive CAD. She presents today for surgery. She continues to have daily epigastric and sometimes RUQ pain.  Past Medical History:  Diagnosis Date   Arthritis    Bladder neoplasm    Cataract    Frequency of urination    Heart murmur    Hepatitis    Hypertension    Hypothyroidism    Neuromuscular disorder ([place])    carpal tunnel   Pneumonia    PONV (postoperative nausea and vomiting)    SEVERE    Past Surgical History:  Procedure Laterality Date   ABDOMINAL HYSTERECTOMY     ABDOMINOPLASTY  2009   CARDIAC CATHETERIZATION  2006  ([place])   normal coronaries   CARDIOVASCULAR STRESS TEST  03-26-2010  dr Gillian Shields   normal study   COLONOSCOPY WITH ESOPHAGOGASTRODUODENOSCOPY (EGD)  2014   CYSTOSCOPY WITH BIOPSY N/A 11/07/2013   Procedure: CYSTOSCOPY WITH BLADDER BIOPSY/FULGURATION;  Surgeon: Festus Aloe, MD;  Location: [facility];  Service: Urology;  Laterality: N/A;   TOTAL ABDOMINAL HYSTERECTOMY W/ BILATERAL SALPINGOOPHORECTOMY  2002    History reviewed. No pertinent family history. Social History:  reports that she has never smoked. She has never used smokeless tobacco. She reports that she does not drink alcohol and does not  use drugs.  Allergies:  Allergies  Allergen Reactions   Penicillins Hives and Swelling   Strawberry Extract Hives   Food Color Red Rash   Food Color Yellow Rash   Ginger Rash   Peanut-Containing Drug Products Rash   Turmeric Rash    Medications Prior to Admission  Medication Sig Dispense Refill   Aloe Vera 470 MG CAPS Take 470 mg by mouth daily.     Cholecalciferol 50 MCG (2000 UT) CAPS Take 2,000 Units by mouth daily.     Chromium 200 MCG TABS Take 200 mcg by mouth daily.     Ivermectin 1 % CREA Apply 1 application topically daily. With Metronidazole and Azelaic acid     levothyroxine (SYNTHROID, LEVOTHROID) 50 MCG tablet Take 50 mcg by mouth daily before breakfast.     Magnesium 400 MG CAPS Take 400 mg by mouth daily.     PANTOTHENIC ACID PO Take 50 mg by mouth daily.     selenium 200 MCG TABS tablet Take 200 mcg by mouth daily.     ZINC SULFATE PO Take 25 mg by mouth daily.     metoprolol tartrate (LOPRESSOR) 100 MG tablet Take one tablet by mouth 2 hours prior to your CT 1 tablet 0    No results found for this or any previous visit (from the past 48 hour(s)). No results found.  Review of Systems  Constitutional:  Negative for chills and fever.  Gastrointestinal:  Positive for abdominal distention.  Neurological:  Negative for speech difficulty and weakness.  Psychiatric/Behavioral:  Negative for agitation and confusion.    Blood pressure (!) 163/70, pulse 69, temperature 98.2 F (36.8 C), temperature source Oral, resp. rate 18, height 5\' 3"  (1.6 m), weight 77 kg, SpO2 98 %. Physical Exam Constitutional:      General: She is not in acute distress.    Appearance: Normal appearance.  HENT:     Head: Normocephalic and atraumatic.  Eyes:     General: No scleral icterus. Pulmonary:     Effort: Pulmonary effort is normal. No respiratory distress.  Abdominal:     General: Abdomen is flat. There is no distension.     Palpations: Abdomen is soft.     Tenderness: There is  no abdominal tenderness.  Musculoskeletal:        General: Normal range of motion.     Cervical back: Normal range of motion.  Skin:    General: Skin is warm and dry.     Coloration: Skin is not jaundiced.  Neurological:     General: No focal deficit present.     Mental Status: She is alert and oriented to person, place, and time.  Psychiatric:        Mood and Affect: Mood normal.        Behavior: Behavior normal.        Thought Content: Thought content normal.     Assessment/Plan 65 yo female with biliary dyskinesia. Proceed to OR for laparoscopic cholecystectomy. Informed consent obtained. Plan for discharge home from PACU.  Dwan Bolt, MD 11/29/2020, 7:20 AM

## 2020-11-29 NOTE — Anesthesia Postprocedure Evaluation (Signed)
Anesthesia Post Note  Patient: Desiree Bryant  Procedure(s) Performed: LAPAROSCOPIC CHOLECYSTECTOMY (Abdomen)     Patient location during evaluation: PACU Anesthesia Type: General Level of consciousness: awake and alert, oriented and patient cooperative Pain management: pain level controlled Vital Signs Assessment: post-procedure vital signs reviewed and stable Respiratory status: spontaneous breathing, nonlabored ventilation and respiratory function stable Cardiovascular status: blood pressure returned to baseline and stable Postop Assessment: no apparent nausea or vomiting Anesthetic complications: no   No notable events documented.  Last Vitals:  Vitals:   11/29/20 0930 11/29/20 0935  BP:  (!) 141/92  Pulse:  73  Resp:  15  Temp: (!) 36.1 C   SpO2:  (!) 89%    Last Pain:  Vitals:   11/29/20 0925  TempSrc:   PainSc: [place]

## 2020-11-30 ENCOUNTER — Encounter (HOSPITAL_COMMUNITY): Payer: Self-pay | Admitting: Surgery

## 2020-11-30 LAB — SURGICAL PATHOLOGY

## 2022-01-03 ENCOUNTER — Encounter (HOSPITAL_BASED_OUTPATIENT_CLINIC_OR_DEPARTMENT_OTHER): Payer: Self-pay | Admitting: Emergency Medicine

## 2022-01-03 ENCOUNTER — Emergency Department (HOSPITAL_BASED_OUTPATIENT_CLINIC_OR_DEPARTMENT_OTHER): Payer: Federal, State, Local not specified - PPO | Admitting: Radiology

## 2022-01-03 ENCOUNTER — Emergency Department (HOSPITAL_BASED_OUTPATIENT_CLINIC_OR_DEPARTMENT_OTHER)
Admission: EM | Admit: 2022-01-03 | Discharge: 2022-01-03 | Disposition: A | Discharge status: Home / Self Care | Payer: Federal, State, Local not specified - PPO | Attending: Emergency Medicine | Admitting: Emergency Medicine

## 2022-01-03 ENCOUNTER — Other Ambulatory Visit: Payer: Self-pay

## 2022-01-03 DIAGNOSIS — R0789 Other chest pain: Secondary | ICD-10-CM | POA: Diagnosis not present

## 2022-01-03 DIAGNOSIS — I10 Essential (primary) hypertension: Secondary | ICD-10-CM | POA: Diagnosis not present

## 2022-01-03 DIAGNOSIS — R079 Chest pain, unspecified: Secondary | ICD-10-CM | POA: Diagnosis present

## 2022-01-03 DIAGNOSIS — Z79899 Other long term (current) drug therapy: Secondary | ICD-10-CM | POA: Diagnosis not present

## 2022-01-03 DIAGNOSIS — Z91010 Allergy to peanuts: Secondary | ICD-10-CM | POA: Diagnosis not present

## 2022-01-03 LAB — CBC
HCT: 45.7 % (ref 36.0–46.0)
Hemoglobin: 15.3 g/dL — ABNORMAL HIGH (ref 12.0–15.0)
MCH: 31.6 pg (ref 26.0–34.0)
MCHC: 33.5 g/dL (ref 30.0–36.0)
MCV: 94.4 fL (ref 80.0–100.0)
Platelets: 275 10*3/uL (ref 150–400)
RBC: 4.84 MIL/uL (ref 3.87–5.11)
RDW: 13 % (ref 11.5–15.5)
WBC: 4.1 10*3/uL (ref 4.0–10.5)
nRBC: 0 % (ref 0.0–0.2)

## 2022-01-03 LAB — BASIC METABOLIC PANEL
Anion gap: 11 (ref 5–15)
BUN: 11 mg/dL (ref 8–23)
CO2: 26 mmol/L (ref 22–32)
Calcium: 11.1 mg/dL — ABNORMAL HIGH (ref 8.9–10.3)
Chloride: 104 mmol/L (ref 98–111)
Creatinine, Ser: 0.84 mg/dL (ref 0.44–1.00)
GFR, Estimated: >60 mL/min (ref >60)
Glucose, Bld: 117 mg/dL — ABNORMAL HIGH (ref 70–99)
Potassium: 3.7 mmol/L (ref 3.5–5.1)
Sodium: 141 mmol/L (ref 135–145)

## 2022-01-03 LAB — TROPONIN I (HIGH SENSITIVITY)
Troponin I (High Sensitivity): <2 ng/L (ref <18)
Troponin I (High Sensitivity): 3 ng/L (ref <18)

## 2022-01-03 NOTE — ED Provider Notes (Addendum)
[facility] EMERGENCY DEPT Provider Note   CSN: [id number] Arrival date & time: 01/03/22  1126     History Patient Active Problem List   Diagnosis Date Noted   DYSPNEA 03/09/2010   COUGH 03/09/2010   HYPERLIPIDEMIA 03/08/2010   Essential hypertension 03/08/2010    Chief Complaint  Patient presents with   Chest Pain    Desiree Bryant is a 66 y.o. female   Chest Pain  66 year old female with history of cough, hyperlipidemia presenting to the emergency department with chest pain.  Patient reports chest pain began last night.  She reports the pain was sharp.  Pain radiated to the back.  She denies any associated symptoms such as nausea, vomiting, diaphoresis, lightheadedness, syncope.  She reports the pain is intermittent.  Denies any pain at this time, reports mild pressure.  No modifying factors, pain is not pleuritic or exertional.  She has not taken anything for the pain.  No cough, fevers, chills, sore throat, runny nose.  No recent travel or surgeries.  No leg pain.      Home Medications Prior to Admission medications   Medication Sig Start Date End Date Taking? Authorizing Provider  Aloe Vera 470 MG CAPS Take 470 mg by mouth daily.    [provider]  Cholecalciferol 50 MCG (2000 UT) CAPS Take 2,000 Units by mouth daily.    [provider]  Chromium 200 MCG TABS Take 200 mcg by mouth daily.    [provider]  HYDROcodone-acetaminophen (NORCO/VICODIN) 5-325 MG tablet Take 1 tablet by mouth every 6 (six) hours as needed for moderate pain. 11/29/20   Dwan Bolt, MD  Ivermectin 1 % CREA Apply 1 application topically daily. With Metronidazole and Azelaic acid    [provider]  levothyroxine (SYNTHROID, LEVOTHROID) 50 MCG tablet Take 50 mcg by mouth daily before breakfast.    [provider]  Magnesium 400 MG CAPS Take 400 mg by mouth daily.    [provider]  metoprolol tartrate (LOPRESSOR) 100 MG  tablet Take one tablet by mouth 2 hours prior to your CT 09/23/20   Belva Crome, MD  PANTOTHENIC ACID PO Take 50 mg by mouth daily.    [provider]  selenium 200 MCG TABS tablet Take 200 mcg by mouth daily.    [provider]  ZINC SULFATE PO Take 25 mg by mouth daily.    [provider]      Allergies    Atorvastatin, Penicillins, Pravastatin sodium, Procaine, Strawberry extract, Yellow dyes (non-tartrazine), Food color red, Food color yellow, Ginger, Peanut-containing drug products, and Turmeric    Review of Systems   Review of Systems  Cardiovascular:  Positive for chest pain.  See HPI  Physical Exam Updated Vital Signs BP (!) 141/89   Pulse 85   Temp 97.8 F (36.6 C)   Resp 17   Ht '5\' 3"'$  (1.6 m)   Wt 74.8 kg   SpO2 99%   BMI 29.23 kg/m  Physical Exam Constitutional:      General: She is not in acute distress.    Appearance: She is well-developed.  HENT:     Head: Normocephalic and atraumatic.     Mouth/Throat:     Mouth: Mucous membranes are moist.  Eyes:     Pupils: Pupils are equal, round, and reactive to light.  Cardiovascular:     Rate and Rhythm: Normal rate and regular rhythm.     Pulses:  Radial pulses are 2+ on the right side and 2+ on the left side.     Heart sounds: No murmur heard. Pulmonary:     Effort: Pulmonary effort is normal. No respiratory distress.     Breath sounds: Normal breath sounds.  Abdominal:     General: Abdomen is flat.     Palpations: Abdomen is soft.     Tenderness: There is no abdominal tenderness.  Musculoskeletal:        General: No tenderness.     Right lower leg: No edema.     Left lower leg: No edema.  Skin:    General: Skin is warm and dry.  Neurological:     General: No focal deficit present.     Mental Status: She is alert. Mental status is at baseline.     Comments: Cranial nerves II through XII intact, strength 5 out of 5 in the bilateral upper and lower extremities, no  sensory deficit to light touch, no dysmetria on finger-nose-finger testing, ambulatory with steady gait.   Psychiatric:        Mood and Affect: Mood normal.        Behavior: Behavior normal.     ED Results / Procedures / Treatments   Labs (all labs ordered are listed, but only abnormal results are displayed) Labs Reviewed  BASIC METABOLIC PANEL - Abnormal; Notable for the following components:      Result Value   Glucose, Bld 117 (*)    Calcium 11.1 (*)    All other components within normal limits  CBC - Abnormal; Notable for the following components:   Hemoglobin 15.3 (*)    All other components within normal limits  TROPONIN I (HIGH SENSITIVITY)  TROPONIN I (HIGH SENSITIVITY)    EKG None  Radiology DG Chest 2 View  Result Date: 01/03/2022 CLINICAL DATA:  Chest pain EXAM: CHEST - 2 VIEW COMPARISON:  08/09/2009 FINDINGS: The heart size and mediastinal contours are within normal limits. Both lungs are clear. The visualized skeletal structures are unremarkable. IMPRESSION: No active cardiopulmonary disease. Electronically Signed   By: Elmer Picker M.D.   On: 01/03/2022 12:11    Procedures Procedures   Medications Ordered in ED Medications - No data to display  ED Course/ Medical Decision Making/ A&P Clinical Course as of 01/03/22 1538  Tue Jan 03, 2022  1505 Labs overall reassuring. Patient reports she already scheduled a cardiology appointment in 2 days. Repeat troponin pending. If repeat troponin negative likely discharge with outpatient follow up. Patient denies symptoms at this time.  [WS]  1530 Repeat troponin negative. Will discharge patient to home. All questions answered. Patient comfortable with plan of discharge. Return precautions discussed with patient and specified on the after visit summary.  [WS]    Clinical Course User Index [WS] Cristie Hem, MD                           Medical Decision Making Amount and/or Complexity of Data  Reviewed Labs: ordered. Radiology: ordered.   66 year old female presenting to the emergency department with chest pain.  Patient well-appearing, vitals with mild hypertension otherwise reassuring, exam unremarkable. EKG without acute ST or T wave changes concerning for ischemia, normal sinus rhythm.  Low concern for ACS, symptoms atypical, without associated symptoms, and EKG is reassuring.  Patient had reassuring coronary CT 1 year ago which demonstrated only minimal nonobstructive CAD.  Doubt PE without pleuritic pain, shortness of  breath, risk factors.  Chest x-ray without findings concerning for pneumonia.  Doubt dissection given intermittent nature of pain, chest x-ray with normal cardiomediastinal silhouette, no other associated symptoms to raise concern e.g. syncope.  Chest x-ray without signs of pneumothorax.  Doubt esophageal pathology without nausea or vomiting.  Will check labs including troponin.  If troponin negative, likely discharge with close outpatient follow-up with PMD.         Final Clinical Impression(s) / ED Diagnoses Final diagnoses:  Atypical chest pain    Rx / DC Orders ED Discharge Orders     None         Cristie Hem, MD 01/03/22 1530    Cristie Hem, MD 01/03/22 1538

## 2022-01-03 NOTE — ED Notes (Signed)
Patient transported to X-ray 

## 2022-01-03 NOTE — Progress Notes (Signed)
Office Visit    Patient Name: Desiree Bryant Date of Encounter: 01/05/2022  Primary Care Provider:  London Pepper, MD Primary Cardiologist:  Lauree Chandler, MD Primary Electrophysiologist: None  Chief Complaint    Desiree Bryant is a 66 y.o. female with PMH of atypical chest pain, minimal CAD via cardiac CTA hypothyroidism, bladder neoplasm who presents today for evaluation of chest pain following recent ED visit.  Past Medical History    Past Medical History:  Diagnosis Date   Arthritis    Bladder neoplasm    Cataract    Frequency of urination    Heart murmur    Hepatitis    Hypertension    Hypothyroidism    Neuromuscular disorder ([place])    carpal tunnel   Pneumonia    PONV (postoperative nausea and vomiting)    SEVERE   Past Surgical History:  Procedure Laterality Date   ABDOMINAL HYSTERECTOMY     ABDOMINOPLASTY  2009   CARDIAC CATHETERIZATION  2006  ([place])   normal coronaries   CARDIOVASCULAR STRESS TEST  03-26-2010  dr Gillian Shields   normal study   CHOLECYSTECTOMY N/A 11/29/2020   Procedure: LAPAROSCOPIC CHOLECYSTECTOMY;  Surgeon: Dwan Bolt, MD;  Location: [place];  Service: General;  Laterality: N/A;   COLONOSCOPY WITH ESOPHAGOGASTRODUODENOSCOPY (EGD)  2014   CYSTOSCOPY WITH BIOPSY N/A 11/07/2013   Procedure: CYSTOSCOPY WITH BLADDER BIOPSY/FULGURATION;  Surgeon: Festus Aloe, MD;  Location: [facility];  Service: Urology;  Laterality: N/A;   TOTAL ABDOMINAL HYSTERECTOMY W/ BILATERAL SALPINGOOPHORECTOMY  2002    Allergies  Allergies  Allergen Reactions   Atorvastatin Other (See Comments)   Penicillins Hives, Swelling and Other (See Comments)   Pravastatin Sodium Other (See Comments)   Procaine Other (See Comments)   Strawberry Extract Hives   Yellow Dyes (Non-Tartrazine) Other (See Comments)   Food Color Red Rash   Food Color Yellow Rash   Ginger Rash   Peanut-Containing Drug Products Rash   Turmeric Rash    History of  Present Illness    Desiree Bryant is a 66 year old female with the above-mentioned past medical history who presents today for evaluation of chest pain.  She was previously seen by Dr. Marlou Porch in 2010 and underwent exercise treadmill test and 2D echo that were both normal following complaint of chest pain.  She additionally went underwent left heart cath in 2006 that was also normal with no ischemic findings.  She was most recently seen by Dr. Tamala Julian in 09/2020 for similar complaint.  She was noted to have extremely elevated cholesterol with LDL of 200.  She was sent for coronary CTA with FFR which revealed a calcium score of 1 with CAD-RADS score of 1 indicating minimal nonobstructive CAD.  No further work-up was completed at that time.  She was seen in the ED at [facility] for evaluation of chest pain.  Patient described pain as radiating to her back and denied any associated complaints of syncope and/V or diaphoresis.  Troponins were negative and EKG was unremarkable with no acute findings.  She was discharged in satisfactory condition and advised to follow-up with cardiology.  Since last being seen in the office patient reports that she is doing well and feeling much better after her previous visit to the ED for chest pain. She states that her chest pain was right of center of her chest and lasted total of 2 minutes and was not associated with any palpitations.  She denies  any discomfort or pressure with exertion currently.  In the office today her blood pressure was elevated at 138/90 with a heart rate of 98.  She states that her blood pressure is in the 852D 782U systolically at home.  Patient denies chest pain, palpitations, dyspnea, PND, orthopnea, nausea, vomiting, dizziness, syncope, edema, weight gain, or early satiety.  Home Medications    Current Outpatient Medications  Medication Sig Dispense Refill   Aloe Vera 470 MG CAPS Take 470 mg by mouth daily.     Chromium 200 MCG TABS Take  200 mcg by mouth daily.     hydrochlorothiazide (HYDRODIURIL) 25 MG tablet Take 1 tablet (25 mg total) by mouth daily. 90 tablet 3   levothyroxine (SYNTHROID) 50 MCG tablet Take 50 mcg by mouth daily before breakfast.     Magnesium 400 MG CAPS Take 400 mg by mouth daily.     PANTOTHENIC ACID PO Take 50 mg by mouth daily.     selenium 200 MCG TABS tablet Take 200 mcg by mouth daily.     ZINC SULFATE PO Take 25 mg by mouth daily.     No current facility-administered medications for this visit.     Review of Systems  Please see the history of present illness.    (+) Lower extremity edema  All other systems reviewed and are otherwise negative except as noted above.  Physical Exam    Wt Readings from Last 3 Encounters:  01/05/22 168 lb 12.8 oz (76.6 kg)  01/03/22 165 lb (74.8 kg)  11/29/20 169 lb 12.1 oz (77 kg)   VS: Vitals:   01/05/22 0953  BP: 138/90  Pulse: 98  SpO2: 95%  ,Body mass index is 29.9 kg/m.  Constitutional:      Appearance: Healthy appearance. Not in distress.  Neck:     Vascular: JVD normal.  Pulmonary:     Effort: Pulmonary effort is normal.     Breath sounds: No wheezing. No rales. Diminished in the bases Cardiovascular:     Normal rate. Regular rhythm. Normal S1. Normal S2.      Murmurs: There is no murmur.  Edema:    Peripheral edema in bilateral lower extremities Abdominal:     Palpations: Abdomen is soft non tender. There is no hepatomegaly.  Skin:    General: Skin is warm and dry.  Neurological:     General: No focal deficit present.     Mental Status: Alert and oriented to person, place and time.     Cranial Nerves: Cranial nerves are intact.  EKG/LABS/Other Studies Reviewed    ECG personally reviewed by me today -none completed today   Lab Results  Component Value Date   WBC 4.1 01/03/2022   HGB 15.3 (H) 01/03/2022   HCT 45.7 01/03/2022   MCV 94.4 01/03/2022   PLT 275 01/03/2022   Lab Results  Component Value Date   CREATININE  0.84 01/03/2022   BUN 11 01/03/2022   NA 141 01/03/2022   K 3.7 01/03/2022   CL 104 01/03/2022   CO2 26 01/03/2022   Lab Results  Component Value Date   ALT 27 11/24/2020   AST 23 11/24/2020   ALKPHOS 88 11/24/2020   BILITOT 0.6 11/24/2020   No results found for: "CHOL", "HDL", "LDLCALC", "LDLDIRECT", "TRIG", "CHOLHDL"  No results found for: "HGBA1C"  Assessment & Plan    1.  Atypical chest pain: -Recently seen in the ED with complaint of chest pain and ruled out for ACS  with troponin and EKG. -Today patient states that her pain has resolved since her ED visit -Cardiac CTA was completed in 2022 with calcium score of 1 and nonobstructive CAD -Patient was advised to contact office if she notices increased discomfort and to report to ED if pain persist.  2.  Elevated blood pressure: -Patient's blood pressure today was 138/90.  She states that her blood pressures at home have been elevated -She will start HCTZ 25 mg daily -Patient will document blood pressures at home and report findings back to the office -Patient was advised to consider [employer] or [employer] for better weight loss and blood pressure control  3.  Hyperlipidemia: -Patient's last LDL cholesterol was 201 -She has a history of statin intolerance and currently is not on any cholesterol medications -Patient had cardiac CTA completed as noted above with calcium score of 1   4.  Overweight: -Patient's current BMI is 29.90 kg/m -Patient is currently attending the gym 3 days a week with her husband and is planning to start a new diet to help facilitate weight loss.     Disposition: Follow-up with Lauree Chandler, MD or APP in 1 months    Medication Adjustments/Labs and Tests Ordered: Current medicines are reviewed at length with the patient today.  Concerns regarding medicines are outlined above.   Signed, Mable Fill, Marissa Nestle, NP 01/05/2022, 10:52 AM [place]

## 2022-01-03 NOTE — ED Triage Notes (Signed)
Pt arrives to ED with c/o chest pain. This started last night and lasted for 5 minutes with associated dizziness and radiation to her back. Pt reports this morning her "head doesn't feel right," she's still dizzy, and has pressure in her chest. The CP is located centralized this morning and described as sharp.

## 2022-01-03 NOTE — ED Notes (Signed)
Pt also reports dizziness, light headiness accompanied by some balance issue.  that started last night at approx 2145.  Pt states her head feels "funny" but denis pain.  Pt also reports some vision issues sinvce the middle od June and is supposed to be scheduled for an appointment with neurologist.

## 2022-01-05 ENCOUNTER — Encounter: Payer: Self-pay | Admitting: Nurse Practitioner

## 2022-01-05 ENCOUNTER — Ambulatory Visit: Payer: Federal, State, Local not specified - PPO | Admitting: Nurse Practitioner

## 2022-01-05 VITALS — BP 138/90 | HR 98 | Ht 63.0 in | Wt 168.8 lb

## 2022-01-05 DIAGNOSIS — R072 Precordial pain: Secondary | ICD-10-CM | POA: Diagnosis not present

## 2022-01-05 DIAGNOSIS — E663 Overweight: Secondary | ICD-10-CM | POA: Diagnosis not present

## 2022-01-05 DIAGNOSIS — I10 Essential (primary) hypertension: Secondary | ICD-10-CM

## 2022-01-05 DIAGNOSIS — E785 Hyperlipidemia, unspecified: Secondary | ICD-10-CM

## 2022-01-05 MED ORDER — HYDROCHLOROTHIAZIDE 25 MG PO TABS: 25.0000 mg | ORAL_TABLET | Freq: Every day | ORAL | 3 refills | Status: DC

## 2022-01-05 NOTE — Patient Instructions (Signed)
Medication Instructions:  Your physician has recommended you make the following change in your medication:  Hydrochlorothiazide '25mg'$   daily  *If you need a refill on your cardiac medications before your next appointment, please call your pharmacy*   Follow-Up: At [facility], you and your health needs are our priority.  As part of our continuing mission to provide you with exceptional heart care, we have created designated Provider Care Teams.  These Care Teams include your primary Cardiologist (physician) and Advanced Practice Providers (APPs -  Physician Assistants and Nurse Practitioners) who all work together to provide you with the care you need, when you need it.  We recommend signing up for the patient portal called "MyChart".  Sign up information is provided on this After Visit Summary.  MyChart is used to connect with patients for Virtual Visits (Telemedicine).  Patients are able to view lab/test results, encounter notes, upcoming appointments, etc.  Non-urgent messages can be sent to your provider as well.   To learn more about what you can do with MyChart, go to [email or web address].    Your next appointment:   1 month(s)  The format for your next appointment:   In Person  Provider:  Ambrose Pancoast [facility]   Other Instructions  Your physician has requested that you regularly monitor and record your blood pressure readings at home. Please use the same machine at the same time of day to check your readings and record them to bring to your follow-up visit.   [employer] [employer]  Compression hose.  Elastic therapy in [place] [phone]

## 2022-01-24 ENCOUNTER — Ambulatory Visit: Payer: Federal, State, Local not specified - PPO | Admitting: Neurology

## 2022-01-24 ENCOUNTER — Telehealth: Payer: Self-pay | Admitting: Neurology

## 2022-01-24 ENCOUNTER — Encounter: Payer: Self-pay | Admitting: Neurology

## 2022-01-24 VITALS — BP 137/86 | HR 76 | Ht 63.0 in | Wt 168.0 lb

## 2022-01-24 DIAGNOSIS — R200 Anesthesia of skin: Secondary | ICD-10-CM | POA: Diagnosis not present

## 2022-01-24 DIAGNOSIS — R2689 Other abnormalities of gait and mobility: Secondary | ICD-10-CM | POA: Diagnosis not present

## 2022-01-24 DIAGNOSIS — R42 Dizziness and giddiness: Secondary | ICD-10-CM

## 2022-01-24 DIAGNOSIS — R202 Paresthesia of skin: Secondary | ICD-10-CM

## 2022-01-24 NOTE — Progress Notes (Signed)
[facility]  PATIENT: Desiree Bryant DOB: April 22, 1956  REQUESTING CLINICIAN: London Pepper, MD HISTORY FROM: Patient  REASON FOR VISIT: recurrent lightheadedness    HISTORICAL  CHIEF COMPLAINT:  Chief Complaint  Patient presents with   New Patient (Initial Visit)    Rm 13, alone C/o Blurry vision, sudden dizziness, states he occurs in spells     HISTORY OF PRESENT ILLNESS:  This is a 66 year old woman with past medical history of hypothyroidism and hypertension who is presenting with episodes of dizziness since May 22.  Patient reported first episode happened around on May 22, she was sitting on her chair and had a sudden onset of dizziness, a feeling of imbalance, she said that she could not move and had to sit for 20 minutes and wait for episode to pass.  She did not attempt to walk.  A couple days later, she did have blurry vision, said that she could not read some of the letters therefore she presented to ophthalmologist who told her that everything is essentially normal.  Since then she had a reoccurrence of the dizziness that she describes as feeling unsteady with incoordination.  Patient denies any room spinning sensation, stated in the past she had episode of vertigo and this is totally different.  In the middle of the month of June she presented to the ED for chest pain radiating to the back all work-up was negative.    Currently she has been complaining of tingling sensation on the left arm, occur intermittently and almost daily. The tingling last about 30 minutes to an hour.  She denies any shooting pain, denies any weakness and denies any neck pain.  Denies any falls, denies any other neurological symptoms, no headaches and no other complaints.      OTHER MEDICAL CONDITIONS: Hypothyroidism, Hypertension   REVIEW OF SYSTEMS: Full 14 system review of systems performed and negative with exception of: As noted in the HPI   ALLERGIES: Allergies   Allergen Reactions   Atorvastatin Other (See Comments)   Penicillins Hives, Swelling and Other (See Comments)   Pravastatin Sodium Other (See Comments)   Procaine Other (See Comments)   Strawberry Extract Hives   Yellow Dyes (Non-Tartrazine) Other (See Comments)   Food Color Red Rash   Food Color Yellow Rash   Ginger Rash   Peanut-Containing Drug Products Rash   Turmeric Rash    HOME MEDICATIONS: Outpatient Medications Prior to Visit  Medication Sig Dispense Refill   Aloe Vera 470 MG CAPS Take 470 mg by mouth daily.     Chromium 200 MCG TABS Take 200 mcg by mouth daily.     hydrochlorothiazide (HYDRODIURIL) 25 MG tablet Take 1 tablet (25 mg total) by mouth daily. 90 tablet 3   levothyroxine (SYNTHROID) 50 MCG tablet Take 50 mcg by mouth daily before breakfast.     Magnesium 400 MG CAPS Take 400 mg by mouth daily.     PANTOTHENIC ACID PO Take 50 mg by mouth daily.     selenium 200 MCG TABS tablet Take 200 mcg by mouth daily.     ZINC SULFATE PO Take 25 mg by mouth daily.     No facility-administered medications prior to visit.    PAST MEDICAL HISTORY: Past Medical History:  Diagnosis Date   Arthritis    Bladder neoplasm    Cataract    Frequency of urination    Heart murmur    Hepatitis    Hypertension    Hypothyroidism  Neuromuscular disorder ([place])    carpal tunnel   Pneumonia    PONV (postoperative nausea and vomiting)    SEVERE    PAST SURGICAL HISTORY: Past Surgical History:  Procedure Laterality Date   ABDOMINAL HYSTERECTOMY     ABDOMINOPLASTY  2009   CARDIAC CATHETERIZATION  2006  ([place])   normal coronaries   CARDIOVASCULAR STRESS TEST  03-26-2010  dr Gillian Shields   normal study   CHOLECYSTECTOMY N/A 11/29/2020   Procedure: LAPAROSCOPIC CHOLECYSTECTOMY;  Surgeon: Dwan Bolt, MD;  Location: [place];  Service: General;  Laterality: N/A;   COLONOSCOPY WITH ESOPHAGOGASTRODUODENOSCOPY (EGD)  2014   CYSTOSCOPY WITH BIOPSY N/A 11/07/2013   Procedure:  CYSTOSCOPY WITH BLADDER BIOPSY/FULGURATION;  Surgeon: Festus Aloe, MD;  Location[facility];  Service: Urology;  Laterality: N/A;   TOTAL ABDOMINAL HYSTERECTOMY W/ BILATERAL SALPINGOOPHORECTOMY  2002    FAMILY HISTORY: History reviewed. No pertinent family history.  SOCIAL HISTORY: Social History   Socioeconomic History   Marital status: Married    Spouse name: Not on file   Number of children: Not on file   Years of education: Not on file   Highest education level: Not on file  Occupational History   Not on file  Tobacco Use   Smoking status: Never   Smokeless tobacco: Never  Vaping Use   Vaping Use: Never used  Substance and Sexual Activity   Alcohol use: No   Drug use: No   Sexual activity: Not on file  Other Topics Concern   Not on file  Social History Narrative   Not on file   Social Determinants of Health   Financial Resource Strain: Not on file  Food Insecurity: Not on file  Transportation Needs: Not on file  Physical Activity: Not on file  Stress: Not on file  Social Connections: Not on file  Intimate Partner Violence: Not on file    PHYSICAL EXAM  GENERAL EXAM/CONSTITUTIONAL: Vitals:  Vitals:   01/24/22 1405  BP: 137/86  Pulse: 76  Weight: 168 lb (76.2 kg)  Height: '5\' 3"'  (1.6 m)   Body mass index is 29.76 kg/m. Wt Readings from Last 3 Encounters:  01/24/22 168 lb (76.2 kg)  01/05/22 168 lb 12.8 oz (76.6 kg)  01/03/22 165 lb (74.8 kg)   Patient is in no distress; well developed, nourished and groomed; neck is supple. Ear canals are clear   EYES: Pupils round and reactive to light, Visual fields full to confrontation, Extraocular movements intacts,   MUSCULOSKELETAL: Gait, strength, tone, movements noted in Neurologic exam below  NEUROLOGIC: MENTAL STATUS:      No data to display         awake, alert, oriented to person, place and time recent and remote memory intact normal attention and  concentration language fluent, comprehension intact, naming intact fund of knowledge appropriate  CRANIAL NERVE:  2nd, 3rd, 4th, 6th - pupils equal and reactive to light, visual fields full to confrontation, extraocular muscles intact, no nystagmus 5th - facial sensation symmetric 7th - facial strength symmetric 8th - hearing intact 9th - palate elevates symmetrically, uvula midline 11th - shoulder shrug symmetric 12th - tongue protrusion midline  MOTOR:  normal bulk and tone, full strength in the BUE, BLE  SENSORY:  normal and symmetric to light touch, pinprick, temperature, vibration  COORDINATION:  finger-nose-finger, fine finger movements normal  REFLEXES:  deep tendon reflexes present and symmetric  GAIT/STATION:  Normal, negative romberg, able to somewhat tandem  DIAGNOSTIC DATA (LABS, IMAGING, TESTING) - I reviewed patient records, labs, notes, testing and imaging myself where available.  Lab Results  Component Value Date   WBC 4.1 01/03/2022   HGB 15.3 (H) 01/03/2022   HCT 45.7 01/03/2022   MCV 94.4 01/03/2022   PLT 275 01/03/2022      Component Value Date/Time   NA 141 01/03/2022 1134   NA 140 09/23/2020 1515   K 3.7 01/03/2022 1134   CL 104 01/03/2022 1134   CO2 26 01/03/2022 1134   GLUCOSE 117 (H) 01/03/2022 1134   BUN 11 01/03/2022 1134   BUN 13 09/23/2020 1515   CREATININE 0.84 01/03/2022 1134   CALCIUM 11.1 (H) 01/03/2022 1134   PROT 6.8 11/24/2020 1400   ALBUMIN 4.0 11/24/2020 1400   AST 23 11/24/2020 1400   ALT 27 11/24/2020 1400   ALKPHOS 88 11/24/2020 1400   BILITOT 0.6 11/24/2020 1400   GFRNONAA >60 01/03/2022 1134   GFRAA  01/17/2009 0204    >60        The eGFR has been calculated using the MDRD equation. This calculation has not been validated in all clinical situations. eGFR's persistently <60 mL/min signify possible Chronic Kidney Disease.   No results found for: "CHOL", "HDL", "LDLCALC", "LDLDIRECT", "TRIG", "CHOLHDL" No  results found for: "HGBA1C" No results found for: "VITAMINB12" No results found for: "TSH"    ASSESSMENT AND PLAN  66 y.o. year old female with history of hypertension and hypothyroidism who is presenting with intermittent episodes of dizziness and balance problem.  She denies any hearing loss, denies any tinnitus; on exam, ear canals are clear of wax.  I will proceed with MRI brain to rule out any central cause of the dizziness and balance problem. She does not have evidence of peripheral neuropathy or ataxia.  If negative then patient will continue to follow with PCP, and if symptoms do not improve then consider vestibular therapy.   When it comes to the left-sided tingling, on exam her reflexes were brisk but symmetric. I advised patient to contact me if she has worsening of her symptoms or if she develops any new pain or weakness, at that time we will consider MRI cervical spine.   1. Balance problem   2. Dizziness   3. Numbness and tingling in left arm      Patient Instructions  MRI brain without contrast, I will contact you to go over the results.  Continue current medications Continue to follow with PCP Return if worse  Orders Placed This Encounter  Procedures   MR BRAIN WO CONTRAST    No orders of the defined types were placed in this encounter.   Return if symptoms worsen or fail to improve.   I have spent a total of 50 minutes dedicated to this patient today, preparing to see patient, performing a medically appropriate examination and evaluation, ordering tests and/or medications and procedures, and counseling and educating the patient/family/caregiver; independently interpreting result and communicating results to the family/patient/caregiver; and documenting clinical information in the electronic medical record.   Alric Ran, MD 01/24/2022, 5:02 PM  [facility] [address], [place], [place] [address] [phone]

## 2022-01-24 NOTE — Patient Instructions (Signed)
MRI brain without contrast, I will contact you to go over the results.  Continue current medications Continue to follow with PCP Return if worse

## 2022-01-24 NOTE — Telephone Encounter (Signed)
[employer] sent to GI

## 2022-01-25 ENCOUNTER — Ambulatory Visit: Payer: Federal, State, Local not specified - PPO | Admitting: Pharmacist

## 2022-01-25 ENCOUNTER — Telehealth: Payer: Self-pay | Admitting: Interventional Cardiology

## 2022-01-25 VITALS — BP 116/80

## 2022-01-25 DIAGNOSIS — I10 Essential (primary) hypertension: Secondary | ICD-10-CM | POA: Diagnosis not present

## 2022-01-25 DIAGNOSIS — E782 Mixed hyperlipidemia: Secondary | ICD-10-CM | POA: Diagnosis not present

## 2022-01-25 MED ORDER — ROSUVASTATIN CALCIUM 10 MG PO TABS: 10.0000 mg | ORAL_TABLET | Freq: Every day | ORAL | 5 refills | Status: DC

## 2022-01-25 NOTE — Patient Instructions (Signed)
Your LDL cholesterol is 201 and your goal is < 100  Start rosuvastatin (Crestor) '10mg'$  - 1 tablet once daily. See if the pharmacy can order this as a white, dye-free tablet  Call clinic if you have any rash or side effects, [phone], and we can try the Repatha injections  Your blood pressure is excellent and at goal < 130/52mHg. Continue taking your HCTZ (hydrochlorothiazide) each day

## 2022-01-25 NOTE — Progress Notes (Signed)
Patient ID: Desiree Bryant                 DOB: 11/02/1955                    MRN: [id number]     HPI: Desiree Bryant is a 66 y.o. female patient of Dr Angelena Form referred to lipid clinic by Ambrose Pancoast, NP. PMH is significant for atypical chest pain, coronary CTA with calcium score of 1 in 09/2020 (7th percentile), hypothyroidism, bladder neoplasm, and arthritis. Has been seen throughout the years for chest pain with negative cardiac workups. Last seen in clinic 01/05/22 where BP was elevated at 138/90 and pt was started on HCTZ '25mg'$  daily. She was referred to PharmD due to history of statin intolerance and LDL > 200.  Pt presents today for follow up. Reports tolerating HCTZ well. It's helped to resolve her abdominal pressure as well as improve her BP which she's very pleased with. Previously had been following a gluten free diet and thought abdominal issues were coming from that, but she's been eating gluten recently and has been feeling just fine. Lost about 3 lbs the first day on HCTZ, pleased with its diuretic effects. Has also started following the Mediterranean diet since her last visit and has lost an extra few pounds with this as well. Has been checking BP in the evening: 119/80, 133/84, 123/88, 131/80, 136/83, 111/75, 109/64, 116/72, 111/74, 119/81, 121/71, 115/84, 119/83, 124/87, 137/86. HR 67-94.  Experienced rash/flushing of her cheeks within a week of starting pravastatin and ezetimibe. Atorvastatin caused muscle aches, no rash though. Does have allergy to red and yellow dye and atorvastatin is white and pravastatin is a colored tablet, although ezetimibe is also a white tablet. Has Fhx of high cholesterol and CAD. Mom had MI in her 33s, grandmother with TIAs, men in her family with earlier MIs, uncle died at 54 from an MI.  Current Medications: none Intolerances: atorvastatin - sore legs, pravastatin and ezetimibe - flushing/rash in cheeks Risk Factors: 10 year ASCVD risk 10.9%, FHx CAD LDL  goal: '100mg'$ /dL  Diet: 2 cups of coffee a day. Started Mediterranean diet recently.  Exercise: Gym 3 days a week and walks her dog daily  Family History: No family history on file.  Social History: No tobacco, alcohol or drug use.  Labs: 09/13/21: TC 301, HDL 87, TG 82, LDL 201, nonHDL 214  Past Medical History:  Diagnosis Date   Arthritis    Bladder neoplasm    Cataract    Frequency of urination    Heart murmur    Hepatitis    Hypertension    Hypothyroidism    Neuromuscular disorder (HCC)    carpal tunnel   Pneumonia    PONV (postoperative nausea and vomiting)    SEVERE    Current Outpatient Medications on File Prior to Visit  Medication Sig Dispense Refill   Aloe Vera 470 MG CAPS Take 470 mg by mouth daily.     Chromium 200 MCG TABS Take 200 mcg by mouth daily.     hydrochlorothiazide (HYDRODIURIL) 25 MG tablet Take 1 tablet (25 mg total) by mouth daily. 90 tablet 3   levothyroxine (SYNTHROID) 50 MCG tablet Take 50 mcg by mouth daily before breakfast.     Magnesium 400 MG CAPS Take 400 mg by mouth daily.     PANTOTHENIC ACID PO Take 50 mg by mouth daily.     selenium 200 MCG TABS tablet Take 200  mcg by mouth daily.     ZINC SULFATE PO Take 25 mg by mouth daily.     No current facility-administered medications on file prior to visit.    Allergies  Allergen Reactions   Atorvastatin Other (See Comments)   Penicillins Hives, Swelling and Other (See Comments)   Pravastatin Sodium Other (See Comments)   Procaine Other (See Comments)   Strawberry Extract Hives   Yellow Dyes (Non-Tartrazine) Other (See Comments)   Food Color Red Rash   Food Color Yellow Rash   Ginger Rash   Peanut-Containing Drug Products Rash   Turmeric Rash    Assessment/Plan:  1. Hyperlipidemia - LDL 201 above goal < 100 for primary prevention with FHx of CAD and elevated 10 year ASCVD risk. Will start rosuvastatin '10mg'$  daily - pt advised to check with pharmacy to see if they can use a  manufacturer that orders white tablets in case her prior rash was related to dye vs statin. She'll call clinic if she has trouble tolerating rosuvastatin, in which case will pursue Repatha instead. Reviewed injection technique at visit today. Has Pharmacist, community and can use copay card. She'll continue with routine physical activity and Mediterranean diet. F/u labs scheduled in 2-3 months.  2. Hypertension - BP much improved in clinic and at home, now at goal < 130/26mHg after starting HCTZ '25mg'$  daily. This has also helped to resolve her abdominal pressure, may have had extra fluid on board. Will check BMET today to ensure electrolytes and renal function remain stable, no med changes made.  Jaxxon Naeem E. Cylas Falzone, PharmD, BCACP, [id number] [place] [address]Phone: [phone] Fax: [phone]/02/2022 2:04 PM

## 2022-01-25 NOTE — Telephone Encounter (Signed)
Pt c/o medication issue:  1. Name of Medication:   rosuvastatin (CRESTOR) 10 MG tablet  2. How are you currently taking this medication (dosage and times per day)?   3. Are you having a reaction (difficulty breathing--STAT)?   4. What is your medication issue?   Caller stated dye-free medication is not available from their wholesaler.  Caller tele# [phone], option 1.

## 2022-01-25 NOTE — Telephone Encounter (Signed)
Spoke with pharmacy, they stated they cannot get any strength of rosuvastatin in a white tablet. They spoke with pt who will pick up 15 tabs of the rosuvastatin to see how she does. Asked if they had any statins available as white tablets, they only have atorvastatin which pt previously experienced myalgias on. Pt aware to call with any tolerability issues.

## 2022-01-26 LAB — BASIC METABOLIC PANEL
BUN/Creatinine Ratio: 15 (ref 12–28)
BUN: 13 mg/dL (ref 8–27)
CO2: 26 mmol/L (ref 20–29)
Calcium: 10.9 mg/dL — ABNORMAL HIGH (ref 8.7–10.3)
Chloride: 101 mmol/L (ref 96–106)
Creatinine, Ser: 0.84 mg/dL (ref 0.57–1.00)
Glucose: 86 mg/dL (ref 70–99)
Potassium: 4.1 mmol/L (ref 3.5–5.2)
Sodium: 145 mmol/L — ABNORMAL HIGH (ref 134–144)
eGFR: 76 mL/min/{1.73_m2} (ref >59)

## 2022-01-27 ENCOUNTER — Ambulatory Visit
Admission: RE | Admit: 2022-01-27 | Discharge: 2022-01-27 | Disposition: A | Discharge status: Home / Self Care | Payer: Federal, State, Local not specified - PPO | Source: Ambulatory Visit | Attending: Neurology | Admitting: Neurology

## 2022-01-27 DIAGNOSIS — R2689 Other abnormalities of gait and mobility: Secondary | ICD-10-CM | POA: Diagnosis not present

## 2022-01-31 ENCOUNTER — Telehealth: Payer: Self-pay | Admitting: Pharmacist

## 2022-01-31 ENCOUNTER — Encounter: Payer: Self-pay | Admitting: Neurology

## 2022-01-31 NOTE — Telephone Encounter (Deleted)
Pt called with tolerability update - has not had a rash or myalgias with the rosuvastatin but has had GI upset. Feels bloated and has some abdominal pain that started after starting her rosuvastatin. Has been taking the tablets with food.  Pt will stop her rosuvastatin for a few days and see if her GI symptoms resolve, then try rechallenging with rosuvastatin. She'll call with a med update. Ideally will tolerate well, if not, will pursue Repatha as a backup option which she is agreeable to.

## 2022-01-31 NOTE — Telephone Encounter (Signed)
Pt called with tolerability update - has not had a rash or myalgias with the rosuvastatin but has had GI upset. Feels bloated and has some abdominal pain that started after starting her rosuvastatin. Has been taking the tablets with food.   Pt will stop her rosuvastatin for a few days and see if her GI symptoms resolve, then try rechallenging with rosuvastatin. She'll call with a med update. Ideally will tolerate well, if not, will pursue Repatha as a backup option which she is agreeable to.

## 2022-02-01 NOTE — Progress Notes (Signed)
Office Visit    Patient Name: Desiree Bryant Date of Encounter: 02/07/2022  Primary Care Provider:  London Pepper, MD Primary Cardiologist:  None Primary Electrophysiologist: None  Chief Complaint   Desiree Bryant is a 66 y.o. female with PMH of atypical chest pain, minimal CAD via cardiac CTA hypothyroidism, bladder neoplasm who presents today for evaluation of chest pain following recent ED visit.  Past Medical History    Past Medical History:  Diagnosis Date   Arthritis    Bladder neoplasm    Cataract    Frequency of urination    Heart murmur    Hepatitis    Hypertension    Hypothyroidism    Neuromuscular disorder ([place])    carpal tunnel   Pneumonia    PONV (postoperative nausea and vomiting)    SEVERE   Past Surgical History:  Procedure Laterality Date   ABDOMINAL HYSTERECTOMY     ABDOMINOPLASTY  2009   CARDIAC CATHETERIZATION  2006  ([place])   normal coronaries   CARDIOVASCULAR STRESS TEST  03-26-2010  dr Gillian Shields   normal study   CHOLECYSTECTOMY N/A 11/29/2020   Procedure: LAPAROSCOPIC CHOLECYSTECTOMY;  Surgeon: Dwan Bolt, MD;  Location: [facility];  Service: General;  Laterality: N/A;   COLONOSCOPY WITH ESOPHAGOGASTRODUODENOSCOPY (EGD)  2014   CYSTOSCOPY WITH BIOPSY N/A 11/07/2013   Procedure: CYSTOSCOPY WITH BLADDER BIOPSY/FULGURATION;  Surgeon: Festus Aloe, MD;  Location: [facility];  Service: Urology;  Laterality: N/A;   TOTAL ABDOMINAL HYSTERECTOMY W/ BILATERAL SALPINGOOPHORECTOMY  2002    Allergies  Allergies  Allergen Reactions   Atorvastatin Other (See Comments)   Penicillins Hives, Swelling and Other (See Comments)   Pravastatin Sodium Other (See Comments)   Procaine Other (See Comments)   Strawberry Extract Hives   Yellow Dyes (Non-Tartrazine) Other (See Comments)   Food Color Red Rash   Food Color Yellow Rash   Ginger Rash   Peanut-Containing Drug Products Rash   Turmeric Rash    History of Present Illness     Desiree Bryant is a 66 year old female with the above-mentioned past medical history who presents today for evaluation of chest pain.  She was previously seen by Dr. Marlou Porch in 2010 and underwent exercise treadmill test and 2D echo that were both normal following complaint of chest pain.  She additionally went underwent left heart cath in 2006 that was also normal with no ischemic findings.  She was most recently seen by Dr. Tamala Julian in 09/2020 for similar complaint.  She was noted to have extremely elevated cholesterol with LDL of 200.  She was sent for coronary CTA with FFR which revealed a calcium score of 1 with CAD-RADS score of 1 indicating minimal nonobstructive CAD.  No further work-up was completed at that time.   She was seen in the ED at [facility] for evaluation of chest pain.  Patient described pain as radiating to her back and denied any associated complaints of syncope and or diaphoresis.  Troponins were negative and EKG was unremarkable with no acute findings.  She was discharged in satisfactory condition and advised to follow-up with cardiology.   Since last being seen in the office patient reports she has been doing well with no complaints of chest discomfort or excess swelling.  She is tolerating her medications without any side effects.  She is still going to the gym 3 days a week and is walking her dog daily around her neighborhood.  She has changed  her eating habits and is now using the [place].  Patient denies chest pain, palpitations, dyspnea, PND, orthopnea, nausea, vomiting, dizziness, syncope, edema, weight gain, or early satiety.  Home Medications    Current Outpatient Medications  Medication Sig Dispense Refill   Aloe Vera 470 MG CAPS Take 470 mg by mouth daily.     Chromium 200 MCG TABS Take 200 mcg by mouth daily.     hydrochlorothiazide (HYDRODIURIL) 25 MG tablet Take 1 tablet (25 mg total) by mouth daily. 90 tablet 3   levothyroxine (SYNTHROID) 50 MCG  tablet Take 50 mcg by mouth daily before breakfast.     Magnesium 400 MG CAPS Take 400 mg by mouth daily.     PANTOTHENIC ACID PO Take 50 mg by mouth daily.     rosuvastatin (CRESTOR) 10 MG tablet Take 1 tablet (10 mg total) by mouth daily. 30 tablet 5   selenium 200 MCG TABS tablet Take 200 mcg by mouth daily.     ZINC SULFATE PO Take 25 mg by mouth daily.     No current facility-administered medications for this visit.     Review of Systems  Please see the history of present illness.     All other systems reviewed and are otherwise negative except as noted above.  Physical Exam    Wt Readings from Last 3 Encounters:  02/07/22 163 lb 12.8 oz (74.3 kg)  01/24/22 168 lb (76.2 kg)  01/05/22 168 lb 12.8 oz (76.6 kg)   VS: Vitals:   02/07/22 1432  BP: 124/74  Pulse: 75  SpO2: 95%  ,Body mass index is 29.02 kg/m.  Constitutional:      Appearance: Healthy appearance. Not in distress.  Neck:     Vascular: JVD normal.  Pulmonary:     Effort: Pulmonary effort is normal.     Breath sounds: No wheezing. No rales. Diminished in the bases Cardiovascular:     Normal rate. Regular rhythm. Normal S1. Normal S2.      Murmurs: There is no murmur.  Edema:    Peripheral edema absent.  Abdominal:     Palpations: Abdomen is soft non tender. There is no hepatomegaly.  Skin:    General: Skin is warm and dry.  Neurological:     General: No focal deficit present.     Mental Status: Alert and oriented to person, place and time.     Cranial Nerves: Cranial nerves are intact.  EKG/LABS/Other Studies Reviewed    ECG personally reviewed by me today -no complaints today    Lab Results  Component Value Date   WBC 4.1 01/03/2022   HGB 15.3 (H) 01/03/2022   HCT 45.7 01/03/2022   MCV 94.4 01/03/2022   PLT 275 01/03/2022   Lab Results  Component Value Date   CREATININE 0.84 01/25/2022   BUN 13 01/25/2022   NA 145 (H) 01/25/2022   K 4.1 01/25/2022   CL 101 01/25/2022   CO2 26  01/25/2022   Lab Results  Component Value Date   ALT 27 11/24/2020   AST 23 11/24/2020   ALKPHOS 88 11/24/2020   BILITOT 0.6 11/24/2020   No results found for: "CHOL", "HDL", "LDLCALC", "LDLDIRECT", "TRIG", "CHOLHDL"  No results found for: "HGBA1C"  Assessment & Plan    1.  Precordial pain -Recently seen in the ED with complaint of chest pain and ruled out for ACS with troponin and EKG. -Today patient states that her pain has resolved since her ED visit -  Cardiac CTA was completed in 2022 with calcium score of 1 and nonobstructive CAD  2.  Elevated blood pressure: -Patient's blood pressure today was 124/84   -Continue hydrochlorothiazide 25 mg -Potassium and renal function both within normal limits   3.  Hyperlipidemia: -Patient's last LDL cholesterol was 201 -She has a history of statin intolerance she is currently being followed by lipid clinic for work-up of possible Repatha therapy if intolerance persist   4.  Overweight: -Patient's current BMI is 29.90 kg/m -Patient is currently attending the gym 3 days a week with and she is currently following the Mediterranean diet    Disposition: Follow-up with None or APP in 12 months   Medication Adjustments/Labs and Tests Ordered: Current medicines are reviewed at length with the patient today.  Concerns regarding medicines are outlined above.   Signed, Mable Fill, Marissa Nestle, NP 02/07/2022, 3:09 PM [place]

## 2022-02-07 ENCOUNTER — Ambulatory Visit: Payer: Federal, State, Local not specified - PPO | Admitting: Nurse Practitioner

## 2022-02-07 ENCOUNTER — Encounter: Payer: Self-pay | Admitting: Nurse Practitioner

## 2022-02-07 VITALS — BP 124/74 | HR 75 | Ht 63.0 in | Wt 163.8 lb

## 2022-02-07 DIAGNOSIS — E663 Overweight: Secondary | ICD-10-CM | POA: Diagnosis not present

## 2022-02-07 DIAGNOSIS — I10 Essential (primary) hypertension: Secondary | ICD-10-CM

## 2022-02-07 DIAGNOSIS — R072 Precordial pain: Secondary | ICD-10-CM

## 2022-02-07 DIAGNOSIS — E782 Mixed hyperlipidemia: Secondary | ICD-10-CM

## 2022-02-07 NOTE — Patient Instructions (Signed)
Medication Instructions:  Your physician recommends that you continue on your current medications as directed. Please refer to the Current Medication list given to you today.  *If you need a refill on your cardiac medications before your next appointment, please call your pharmacy*   Lab Work: None ordered   If you have labs (blood work) drawn today and your tests are completely normal, you will receive your results only by: [place] (if you have MyChart) OR A paper copy in the mail If you have any lab test that is abnormal or we need to change your treatment, we will call you to review the results.   Testing/Procedures: None ordered    Follow-Up: At [facility], you and your health needs are our priority.  As part of our continuing mission to provide you with exceptional heart care, we have created designated Provider Care Teams.  These Care Teams include your primary Cardiologist (physician) and Advanced Practice Providers (APPs -  Physician Assistants and Nurse Practitioners) who all work together to provide you with the care you need, when you need it.  We recommend signing up for the patient portal called "MyChart".  Sign up information is provided on this After Visit Summary.  MyChart is used to connect with patients for Virtual Visits (Telemedicine).  Patients are able to view lab/test results, encounter notes, upcoming appointments, etc.  Non-urgent messages can be sent to your provider as well.   To learn more about what you can do with MyChart, go to [email or web address].    Your next appointment:   12 month(s)  The format for your next appointment:   In Person  Provider:   Dr. Lauree Chandler or APP      Other Instructions   Important Information About Sugar

## 2022-02-24 ENCOUNTER — Telehealth: Payer: Self-pay | Admitting: Pharmacist

## 2022-02-24 NOTE — Telephone Encounter (Signed)
Patient called to give update. She has not restarted rosuvastatin. She was wondering since she totally changed her diet and is exercising if she could wait until labs in oct to add medication. I did advise that with her baseline of 201 it was highly unlikely that her LDL-C would get to <100. I did congratulate her on lifestyle changes and acknowledged that these are very important but medication can give an extra layer of protection. Patient is having a skin cancer surgery on Monday and would like to wait until labs are rechecked 10/10. If labs are still high she would like to try Repatha.

## 2022-02-27 ENCOUNTER — Telehealth (HOSPITAL_COMMUNITY): Payer: Self-pay | Admitting: *Deleted

## 2022-03-23 NOTE — Progress Notes (Signed)
VASCULAR & VEIN SPECIALISTS           OF [place]  History and Physical   Desiree Bryant is a 66 y.o. female who presents with hyperpigmentation of her lower extremities with right worse than left.  She states that her cardiologist felt she needed to be evaluated.  She does have swelling in her legs with right worse than left.  She states this is better in the morning after being in bed and laying flat.  She does not have any hx of DVT.  She does have spider veins bilaterally.   She states that her brother has hx of vein issues.  She has started wearing compression socks after being recommended by her cardiologist.  She does not endorse claudication.  She does not have any non healing wounds.  She states that she has started the [place] and has lost about 10 lbs.    She states that in June, they thought she had an MI but she did not.   She states that she is also being followed for elevated cholesterol.   The pt is on a statin for cholesterol management.  The pt is not on a daily aspirin.   Other AC:  none The pt is on HCTZ for hypertension.   The pt is not diabetic.   Tobacco hx:  never  Pt does have family hx of AAA with her maternal uncle.  She did have an abdominal u/s in 2022 and this was negative for AAA.    Past Medical History:  Diagnosis Date   Arthritis    Bladder neoplasm    Cataract    Frequency of urination    Heart murmur    Hepatitis    Hypertension    Hypothyroidism    Neuromuscular disorder ([place])    carpal tunnel   Pneumonia    PONV (postoperative nausea and vomiting)    SEVERE    Past Surgical History:  Procedure Laterality Date   ABDOMINAL HYSTERECTOMY     ABDOMINOPLASTY  2009   CARDIAC CATHETERIZATION  2006  ([place]   normal coronaries   CARDIOVASCULAR STRESS TEST  03-26-2010  dr Gillian Shields   normal study   CHOLECYSTECTOMY N/A 11/29/2020   Procedure: LAPAROSCOPIC CHOLECYSTECTOMY;  Surgeon: Dwan Bolt, MD;  Location: [facility];   Service: General;  Laterality: N/A;   COLONOSCOPY WITH ESOPHAGOGASTRODUODENOSCOPY (EGD)  2014   CYSTOSCOPY WITH BIOPSY N/A 11/07/2013   Procedure: CYSTOSCOPY WITH BLADDER BIOPSY/FULGURATION;  Surgeon: Festus Aloe, MD;  Location: [facility];  Service: Urology;  Laterality: N/A;   TOTAL ABDOMINAL HYSTERECTOMY W/ BILATERAL SALPINGOOPHORECTOMY  2002    Social History   Socioeconomic History   Marital status: Married    Spouse name: Not on file   Number of children: Not on file   Years of education: Not on file   Highest education level: Not on file  Occupational History   Not on file  Tobacco Use   Smoking status: Never   Smokeless tobacco: Never  Vaping Use   Vaping Use: Never used  Substance and Sexual Activity   Alcohol use: No   Drug use: No   Sexual activity: Not on file  Other Topics Concern   Not on file  Social History Narrative   Not on file   Social Determinants of Health   Financial Resource Strain: Not on file  Food Insecurity: Not on file  Transportation Needs: Not  on file  Physical Activity: Not on file  Stress: Not on file  Social Connections: Not on file  Intimate Partner Violence: Not on file    Family hx:  + for vein issues with brother; +AAA maternal uncle.  Current Outpatient Medications  Medication Sig Dispense Refill   Aloe Vera 470 MG CAPS Take 470 mg by mouth daily.     Chromium 200 MCG TABS Take 200 mcg by mouth daily.     hydrochlorothiazide (HYDRODIURIL) 25 MG tablet Take 1 tablet (25 mg total) by mouth daily. 90 tablet 3   levothyroxine (SYNTHROID) 50 MCG tablet Take 50 mcg by mouth daily before breakfast.     Magnesium 400 MG CAPS Take 400 mg by mouth daily.     PANTOTHENIC ACID PO Take 50 mg by mouth daily.     rosuvastatin (CRESTOR) 10 MG tablet Take 1 tablet (10 mg total) by mouth daily. 30 tablet 5   selenium 200 MCG TABS tablet Take 200 mcg by mouth daily.     ZINC SULFATE PO Take 25 mg by mouth daily.     No  current facility-administered medications for this visit.    Allergies  Allergen Reactions   Atorvastatin Other (See Comments)   Penicillins Hives, Swelling and Other (See Comments)   Pravastatin Sodium Other (See Comments)   Procaine Other (See Comments)   Strawberry Extract Hives   Yellow Dyes (Non-Tartrazine) Other (See Comments)   Food Color Red Rash   Food Color Yellow Rash   Ginger Rash   Peanut-Containing Drug Products Rash   Turmeric Rash    REVIEW OF SYSTEMS:   '[X]'$  denotes positive finding, '[ ]'$  denotes negative finding Cardiac  Comments:  Chest pain or chest pressure:    Shortness of breath upon exertion:    Short of breath when lying flat:    Irregular heart rhythm:        Vascular    Pain in calf, thigh, or hip brought on by ambulation:    Pain in feet at night that wakes you up from your sleep:     Blood clot in your veins:    Leg swelling:  x       Pulmonary    Oxygen at home:    Productive cough:     Wheezing:         Neurologic    Sudden weakness in arms or legs:     Sudden numbness in arms or legs:     Sudden onset of difficulty speaking or slurred speech:    Temporary loss of vision in one eye:     Problems with dizziness:         Gastrointestinal    Blood in stool:     Vomited blood:         Genitourinary    Burning when urinating:     Blood in urine:        Psychiatric    Major depression:         Hematologic    Bleeding problems:    Problems with blood clotting too easily:        Skin    Rashes or ulcers:        Constitutional    Fever or chills:      PHYSICAL EXAMINATION:  Today's Vitals   03/31/22 1406  BP: 135/76  Pulse: 68  Temp: 98 F (36.7 C)  TempSrc: Temporal  PainSc: 0-No pain   There is no height  or weight on file to calculate BMI.   General:  WDWN in NAD; vital signs documented above Gait: Not observed HENT: WNL, normocephalic Pulmonary: normal non-labored breathing without wheezing Cardiac: regular  HR; without carotid bruits Abdomen: soft, NT, aortic pulse is not palpable Skin: without rashes; hemosiderin staining BLE with right worse than left Vascular Exam/Pulses:  Right Left  Radial 2+ (normal) 2+ (normal)  DP 2+ (normal) 2+ (normal)  PT 2+ (normal) 2+ (normal)   Extremities: +BLE swelling with right slightly worse than the left with hemosiderin staining right worse than left  Neurologic: A&O X 3;  moving all extremities equally Psychiatric:  The pt has Normal affect.   Non-Invasive Vascular Imaging:   Venous duplex on 03/31/2022: +--------------+---------+------+-----------+------------+---------+  RIGHT         Reflux NoRefluxReflux TimeDiameter cmsComments                           Yes                                    +--------------+---------+------+-----------+------------+---------+  CFV           no                                               +--------------+---------+------+-----------+------------+---------+  FV mid        no                                               +--------------+---------+------+-----------+------------+---------+  Popliteal     no                                               +--------------+---------+------+-----------+------------+---------+  GSV at SFJ              yes    >500 ms      0.74               +--------------+---------+------+-----------+------------+---------+  GSV prox thighno                            0.35               +--------------+---------+------+-----------+------------+---------+  GSV mid thigh no                            0.31               +--------------+---------+------+-----------+------------+---------+  GSV dist thighno                            0.30               +--------------+---------+------+-----------+------------+---------+  GSV at knee   no                            0.27                +--------------+---------+------+-----------+------------+---------+  GSV prox calf no                            0.28               +--------------+---------+------+-----------+------------+---------+  GSV mid calf  no                            0.19               +--------------+---------+------+-----------+------------+---------+  SSV Pop Fossa no                            0.23               +--------------+---------+------+-----------+------------+---------+  SSV prox calf no                            0.21               +--------------+---------+------+-----------+------------+---------+  SSV mid calf            yes    >500 ms      0.23               +--------------+---------+------+-----------+------------+---------+  AASV O        no                            0.12               +--------------+---------+------+-----------+------------+---------+  AASV P        no                            0.24               +--------------+---------+------+-----------+------------+---------+  AASV M                                              too small  +--------------+---------+------+-----------+------------+---------+   Summary:  Right:  - No evidence of deep vein thrombosis seen in the right lower extremity, from the common femoral through the popliteal veins.  - No evidence of superficial venous reflux seen in the right greater saphenous vein.  - Venous reflux is noted in the right sapheno-femoral junction.  - Venous reflux is noted in the right short saphenous vein.  - No evidence of reflux in the AASV.    LACONYA CLERE is a 66 y.o. female who presents with: BLE swelling with hyperpigmentation with right worse than left    -pt has easily palpable  pedal pulses -pt does not have evidence of DVT.  Pt does have venous reflux in the GSV at the [facility] and in the SSV.  She does not have adequate reflux or vein diameter for laser  ablation.  -discussed with pt about wearing knee high 15-20 mmHg compression stockings, which she has been doing this.  -discussed the importance of leg elevation and how to elevate properly - pt is advised to elevate their legs and a diagram is given to them to demonstrate for pt to lay flat on their back with knees elevated and slightly bent with  their feet higher than their knees, which puts their feet higher than their heart for 15 minutes per day.  If pt cannot lay flat, advised to lay as flat as possible.  -pt is advised to continue as much walking as possible and avoid sitting or standing for long periods of time.  -discussed importance of weight loss and exercise and that water aerobics would also be beneficial.  She has lost 10 lbs with dietary changes.  -handout with recommendations given -pt will f/u as needed.    Leontine Locket, [facility] Vein [facility] [phone]  Clinic MD:  Virl Cagey

## 2022-03-24 ENCOUNTER — Other Ambulatory Visit: Payer: Self-pay | Admitting: *Deleted

## 2022-03-24 DIAGNOSIS — R600 Localized edema: Secondary | ICD-10-CM

## 2022-03-28 ENCOUNTER — Ambulatory Visit: Payer: Federal, State, Local not specified - PPO | Attending: Cardiovascular Disease

## 2022-03-28 DIAGNOSIS — E782 Mixed hyperlipidemia: Secondary | ICD-10-CM

## 2022-03-28 LAB — LIPID PANEL
Chol/HDL Ratio: 3.9 ratio (ref 0.0–4.4)
Cholesterol, Total: 293 mg/dL — ABNORMAL HIGH (ref 100–199)
HDL: 76 mg/dL (ref >39)
LDL Chol Calc (NIH): 200 mg/dL — ABNORMAL HIGH (ref 0–99)
Triglycerides: 102 mg/dL (ref 0–149)
VLDL Cholesterol Cal: 17 mg/dL (ref 5–40)

## 2022-03-28 LAB — HEPATIC FUNCTION PANEL
ALT: 28 IU/L (ref 0–32)
AST: 25 IU/L (ref 0–40)
Albumin: 4.9 g/dL (ref 3.9–4.9)
Alkaline Phosphatase: 80 IU/L (ref 44–121)
Bilirubin Total: 0.6 mg/dL (ref 0.0–1.2)
Bilirubin, Direct: 0.15 mg/dL (ref 0.00–0.40)
Total Protein: 7 g/dL (ref 6.0–8.5)

## 2022-03-30 ENCOUNTER — Telehealth: Payer: Self-pay | Admitting: Pharmacist

## 2022-03-30 DIAGNOSIS — E782 Mixed hyperlipidemia: Secondary | ICD-10-CM

## 2022-03-30 NOTE — Telephone Encounter (Signed)
PA for Repatha submitted Key: [id number]  PA denied. Denial letter does not give a clear reason. Will appeal denial. Patient will need to sign allowing [place] to appeal on her behalf. LVM for patient to call back to discuss.

## 2022-03-31 ENCOUNTER — Encounter: Payer: Self-pay | Admitting: Physician Assistant

## 2022-03-31 ENCOUNTER — Ambulatory Visit: Payer: Federal, State, Local not specified - PPO | Admitting: Physician Assistant

## 2022-03-31 ENCOUNTER — Ambulatory Visit (HOSPITAL_COMMUNITY)
Admission: RE | Admit: 2022-03-31 | Discharge: 2022-03-31 | Disposition: A | Discharge status: Home / Self Care | Payer: Federal, State, Local not specified - PPO | Source: Ambulatory Visit | Attending: Vascular Surgery | Admitting: Vascular Surgery

## 2022-03-31 VITALS — BP 135/76 | HR 68 | Temp 98.0°F

## 2022-03-31 DIAGNOSIS — R600 Localized edema: Secondary | ICD-10-CM | POA: Insufficient documentation

## 2022-04-03 NOTE — Telephone Encounter (Signed)
Patient called back. She will download the authorized rep form and drop it off to [place]

## 2022-04-10 NOTE — Telephone Encounter (Signed)
Appeals letter faxed to [facility]

## 2022-04-11 MED ORDER — REPATHA SURECLICK 140 MG/ML ~~LOC~~ SOAJ: 1.0000 mL | SUBCUTANEOUS | 11 refills | Status: DC

## 2022-04-11 NOTE — Telephone Encounter (Signed)
Repatha has been approved through 04/10/23.

## 2022-04-11 NOTE — Telephone Encounter (Signed)
Patient made aware of approval. Rx sent to pharmacy. Labs ordered and scheduled for 12/21.

## 2022-06-08 ENCOUNTER — Ambulatory Visit: Payer: Federal, State, Local not specified - PPO | Attending: Interventional Cardiology

## 2022-06-08 DIAGNOSIS — E782 Mixed hyperlipidemia: Secondary | ICD-10-CM

## 2022-06-09 LAB — LIPID PANEL
Chol/HDL Ratio: 2.3 ratio (ref 0.0–4.4)
Cholesterol, Total: 202 mg/dL — ABNORMAL HIGH (ref 100–199)
HDL: 86 mg/dL (ref >39)
LDL Chol Calc (NIH): 97 mg/dL (ref 0–99)
Triglycerides: 110 mg/dL (ref 0–149)
VLDL Cholesterol Cal: 19 mg/dL (ref 5–40)

## 2022-06-09 LAB — APOLIPOPROTEIN B: Apolipoprotein B: 82 mg/dL (ref <90)

## 2022-09-28 ENCOUNTER — Other Ambulatory Visit: Payer: Self-pay | Admitting: Nurse Practitioner

## 2023-02-01 NOTE — Progress Notes (Signed)
Office Visit    Patient Name: Desiree Bryant Date of Encounter: 02/02/2023  Primary Care Provider:  Farris Has, MD Primary Cardiologist:  None Primary Electrophysiologist: None   Past Medical History    Past Medical History:  Diagnosis Date   Arthritis    Bladder neoplasm    Cataract    Frequency of urination    Heart murmur    Hepatitis    Hypertension    Hypothyroidism    Neuromuscular disorder (H[facility])    carpal tunnel   Pneumonia    PONV (postoperative nausea and vomiting)    SEVERE   Past Surgical History:  Procedure Laterality Date   ABDOMINAL HYSTERECTOMY     ABDOMINOPLASTY  2009   CARDIAC CATHETERIZATION  2006  ([place])   normal coronaries   CARDIOVASCULAR STRESS TEST  03-26-2010  dr Caro Hight   normal study   CHOLECYSTECTOMY N/A 11/29/2020   Procedure: LAPAROSCOPIC CHOLECYSTECTOMY;  Surgeon: Fritzi Mandes, MD;  Location: [facility];  Service: General;  Laterality: N/A;   COLONOSCOPY WITH ESOPHAGOGASTRODUODENOSCOPY (EGD)  2014   CYSTOSCOPY WITH BIOPSY N/A 11/07/2013   Procedure: CYSTOSCOPY WITH BLADDER BIOPSY/FULGURATION;  Surgeon: Jerilee Field, MD;  Location: [facility];  Service: Urology;  Laterality: N/A;   TOTAL ABDOMINAL HYSTERECTOMY W/ BILATERAL SALPINGOOPHORECTOMY  2002    Allergies  Allergies  Allergen Reactions   Atorvastatin Other (See Comments)   Penicillins Hives, Swelling and Other (See Comments)   Pravastatin Sodium Other (See Comments)   Procaine Other (See Comments)   Strawberry Extract Hives   Yellow Dyes (Non-Tartrazine) Other (See Comments)   Food Color Red Rash   Food Color Yellow Rash   Ginger Rash   Peanut-Containing Drug Products Rash   Turmeric Rash     History of Present Illness    Desiree Bryant is a 67 y.o. female with PMH of atypical chest pain, minimal CAD via cardiac CTA hypothyroidism, bladder neoplasm who presents today for 1 year follow-up.   She was previously seen by Dr. Anne Fu in 2010 and  underwent exercise treadmill test and 2D echo that were both normal following complaint of chest pain.  She additionally went underwent left heart cath in 2006 that was also normal with no ischemic findings.  She was most recently seen by Dr. Katrinka Blazing in 09/2020 for similar complaint.  She was seen for evaluation of chest pain the on 01/03/2022 reassuring ACS workup.  She was last seen on 02/07/2022 was doing well with no chest pain complaints.  She continued to go to the gym 3 days a week and was walking her dog daily.  Since last being seen in the office patient reports that she has been experiencing some chest pain over the past 2 weeks.  She denies any dizziness, palpitations, or shortness of breath.  She reports her pain improves by the evening but did have 1 episode that awaken her from her sleep last week.  She continues to tolerate physical activity and goes to the gym 2 days/week and also walks her dog daily.  She denies any aggravation or discomfort with these activities.  She continues to take meloxicam for low back pain and is scheduled to have an ablation on her back pain soon.  She is euvolemic on examination but does have trace lower extremity edema which is chronic.  Patient denies chest pain, palpitations, dyspnea, PND, orthopnea, nausea, vomiting, dizziness, syncope, edema, weight gain, or early satiety.   Home Medications  Current Outpatient Medications  Medication Sig Dispense Refill   Aloe Vera 470 MG CAPS Take 470 mg by mouth daily.     Chromium 200 MCG TABS Take 200 mcg by mouth daily.     Evolocumab (REPATHA SURECLICK) 140 MG/ML SOAJ Inject 140 mg into the skin every 14 (fourteen) days. 2 mL 11   hydrochlorothiazide (HYDRODIURIL) 25 MG tablet TAKE ONE TABLET BY MOUTH EVERY DAY 90 tablet 0   levothyroxine (SYNTHROID) 50 MCG tablet Take 50 mcg by mouth daily before breakfast.     Magnesium 400 MG CAPS Take 400 mg by mouth daily.     meloxicam (MOBIC) 15 MG tablet Take 15 mg by mouth  daily.     PANTOTHENIC ACID PO Take 50 mg by mouth daily.     selenium 200 MCG TABS tablet Take 200 mcg by mouth daily.     ZINC SULFATE PO Take 25 mg by mouth daily.     No current facility-administered medications for this visit.     Review of Systems  Please see the history of present illness.    (+) Chest pain (+) Trace lower extremity edema  All other systems reviewed and are otherwise negative except as noted above.  Physical Exam    Wt Readings from Last 3 Encounters:  02/02/23 164 lb 13.9 oz (74.8 kg)  02/07/22 163 lb 12.8 oz (74.3 kg)  01/24/22 168 lb (76.2 kg)   VS: Vitals:   02/02/23 1337  BP: 128/88  Pulse: 78  SpO2: 98%  ,Body mass index is 29.21 kg/m.  Constitutional:      Appearance: Healthy appearance. Not in distress.  Neck:     Vascular: JVD normal.  Pulmonary:     Effort: Pulmonary effort is normal.     Breath sounds: No wheezing. No rales. Diminished in the bases Cardiovascular:     Normal rate. Regular rhythm. Normal S1. Normal S2.      Murmurs: There is no murmur.  Edema:    Peripheral edema absent.  Abdominal:     Palpations: Abdomen is soft non tender. There is no hepatomegaly.  Skin:    General: Skin is warm and dry.  Neurological:     General: No focal deficit present.     Mental Status: Alert and oriented to person, place and time.     Cranial Nerves: Cranial nerves are intact.  EKG/LABS/ Recent Cardiac Studies    ECG personally reviewed by me today -sinus rhythm with rate of 79 bpm and no acute changes consistent with previous EKG.  Lab Results  Component Value Date   WBC 4.1 01/03/2022   HGB 15.3 (H) 01/03/2022   HCT 45.7 01/03/2022   MCV 94.4 01/03/2022   PLT 275 01/03/2022   Lab Results  Component Value Date   CREATININE 0.84 01/25/2022   BUN 13 01/25/2022   NA 145 (H) 01/25/2022   K 4.1 01/25/2022   CL 101 01/25/2022   CO2 26 01/25/2022   Lab Results  Component Value Date   ALT 28 03/28/2022   AST 25 03/28/2022    ALKPHOS 80 03/28/2022   BILITOT 0.6 03/28/2022   Lab Results  Component Value Date   CHOL 202 (H) 06/08/2022   HDL 86 06/08/2022   LDLCALC 97 06/08/2022   TRIG 110 06/08/2022   CHOLHDL 2.3 06/08/2022    No results found for: "HGBA1C"   Assessment & Plan    1.  Essential hypertension: -Patient's blood pressure today was controlled at  128/88 -Continue Dash type diet and lifestyle modifications.  2.  Hyperlipidemia: -Patient's LDL cholesterol was 97 -Continue Repatha 140 mg q. 14 days -We will recheck lipids and LFTs  3.  Chest pain: -Today patient reports chest pain has been occurring over the past 2 weeks. -Add amlodipine 5 mg daily for antianginal effect -Patient will complete POET to evaluate for possible ischemia.  4.  Hypothyroidism: -Continue current treatment plan per PCP/endocrinology  Disposition: Follow-up with None or APP in 6 months Informed Consent   Shared Decision Making/Informed Consent The risks [chest pain, shortness of breath, cardiac arrhythmias, dizziness, blood pressure fluctuations, myocardial infarction, stroke/transient ischemic attack, and life-threatening complications (estimated to be 1 in 10,000)], benefits (risk stratification, diagnosing coronary artery disease, treatment guidance) and alternatives of an exercise tolerance test were discussed in detail with Ms. Robbins and she agrees to proceed.      Medication Adjustments/Labs and Tests Ordered: Current medicines are reviewed at length with the patient today.  Concerns regarding medicines are outlined above.   Signed, Napoleon Form, Leodis Rains, NP 02/02/2023, 1:57 PM [facility]

## 2023-02-02 ENCOUNTER — Ambulatory Visit: Payer: Federal, State, Local not specified - PPO | Admitting: Nurse Practitioner

## 2023-02-02 ENCOUNTER — Encounter: Payer: Self-pay | Admitting: Nurse Practitioner

## 2023-02-02 VITALS — BP 128/88 | HR 78 | Ht 63.0 in | Wt 164.9 lb

## 2023-02-02 DIAGNOSIS — E782 Mixed hyperlipidemia: Secondary | ICD-10-CM | POA: Diagnosis not present

## 2023-02-02 DIAGNOSIS — R079 Chest pain, unspecified: Secondary | ICD-10-CM | POA: Diagnosis not present

## 2023-02-02 DIAGNOSIS — E039 Hypothyroidism, unspecified: Secondary | ICD-10-CM | POA: Diagnosis not present

## 2023-02-02 DIAGNOSIS — I10 Essential (primary) hypertension: Secondary | ICD-10-CM | POA: Diagnosis not present

## 2023-02-02 MED ORDER — NITROGLYCERIN 0.4 MG SL SUBL: 0.4000 mg | SUBLINGUAL_TABLET | SUBLINGUAL | 3 refills | Status: AC | PRN

## 2023-02-02 NOTE — Patient Instructions (Addendum)
Medication Instructions:  START Norvasc 5mg  Take 1 tablet once a day  START Nitroglycerin 0.4mg  Take 1 as needed for emergency chest pain. Take first dose for emergency chest pain; WAIT 5 minutes and if still having chest pain CALL 911 and then take 2nd dose. IF still having pain wait an additional 5 minutes before taking final dose. Do not take more than 3 doses in a day. *If you need a refill on your cardiac medications before your next appointment, please call your pharmacy*   Lab Work: FASTING LIPIDS & LFTs  If you have labs (blood work) drawn today and your tests are completely normal, you will receive your results only by: MyChart Message (if you have MyChart) OR A paper copy in the mail If you have any lab test that is abnormal or we need to change your treatment, we will call you to review the results.   Testing/Procedures: Your physician has requested that you have an exercise tolerance test. For further information please visit [email or web address] Please also follow instruction sheet, as given. Please schedule for Mid-September.   Follow-Up: At [facility], you and your health needs are our priority.  As part of our continuing mission to provide you with exceptional heart care, we have created designated Provider Care Teams.  These Care Teams include your primary Cardiologist (physician) and Advanced Practice Providers (APPs -  Physician Assistants and Nurse Practitioners) who all work together to provide you with the care you need, when you need it.  We recommend signing up for the patient portal called "MyChart".  Sign up information is provided on this After Visit Summary.  MyChart is used to connect with patients for Virtual Visits (Telemedicine).  Patients are able to view lab/test results, encounter notes, upcoming appointments, etc.  Non-urgent messages can be sent to your provider as well.   To learn more about what you can do with MyChart, go to  [email or web address].    Your next appointment:   6 month(s)  Provider:   Verne Carrow, MD   Other Instructions Check your blood pressure daily for 2 weeks then contact the office with your readings.  Get some ted hose/compression hose wear them daily during the day and remove at night.

## 2023-02-14 ENCOUNTER — Other Ambulatory Visit: Payer: Self-pay | Admitting: Pharmacist

## 2023-02-14 MED ORDER — REPATHA SURECLICK 140 MG/ML ~~LOC~~ SOAJ: 1.0000 mL | SUBCUTANEOUS | 11 refills | Status: DC

## 2023-03-01 ENCOUNTER — Ambulatory Visit: Payer: Federal, State, Local not specified - PPO

## 2023-03-01 ENCOUNTER — Ambulatory Visit: Payer: Federal, State, Local not specified - PPO | Attending: Nurse Practitioner

## 2023-03-01 DIAGNOSIS — E039 Hypothyroidism, unspecified: Secondary | ICD-10-CM | POA: Diagnosis not present

## 2023-03-01 DIAGNOSIS — I10 Essential (primary) hypertension: Secondary | ICD-10-CM

## 2023-03-01 DIAGNOSIS — E782 Mixed hyperlipidemia: Secondary | ICD-10-CM

## 2023-03-01 DIAGNOSIS — R079 Chest pain, unspecified: Secondary | ICD-10-CM | POA: Diagnosis not present

## 2023-03-02 ENCOUNTER — Other Ambulatory Visit: Payer: Federal, State, Local not specified - PPO

## 2023-03-02 LAB — LIPID PANEL
Chol/HDL Ratio: 2.2 ratio (ref 0.0–4.4)
Cholesterol, Total: 180 mg/dL (ref 100–199)
HDL: 81 mg/dL (ref >39)
LDL Chol Calc (NIH): 78 mg/dL (ref 0–99)
Triglycerides: 121 mg/dL (ref 0–149)
VLDL Cholesterol Cal: 21 mg/dL (ref 5–40)

## 2023-03-02 LAB — HEPATIC FUNCTION PANEL
ALT: 20 IU/L (ref 0–32)
AST: 23 IU/L (ref 0–40)
Albumin: 4.5 g/dL (ref 3.9–4.9)
Alkaline Phosphatase: 85 IU/L (ref 44–121)
Bilirubin Total: 0.4 mg/dL (ref 0.0–1.2)
Bilirubin, Direct: 0.14 mg/dL (ref 0.00–0.40)
Total Protein: 6.4 g/dL (ref 6.0–8.5)

## 2023-03-02 LAB — EXERCISE TOLERANCE TEST
Angina Index: 0
Duke Treadmill Score: 1
Estimated workload: 7.3
Exercise duration (min): 6 min
Exercise duration (sec): 16 s
MPHR: 152 {beats}/min
Peak HR: 162 {beats}/min
Percent HR: 106 %
RPE: 15
Rest HR: 76 {beats}/min
ST Depression (mm): 1 mm

## 2023-03-05 ENCOUNTER — Telehealth: Payer: Self-pay

## 2023-03-05 DIAGNOSIS — R9430 Abnormal result of cardiovascular function study, unspecified: Secondary | ICD-10-CM

## 2023-03-05 DIAGNOSIS — R079 Chest pain, unspecified: Secondary | ICD-10-CM

## 2023-03-05 DIAGNOSIS — I10 Essential (primary) hypertension: Secondary | ICD-10-CM

## 2023-03-05 NOTE — Telephone Encounter (Signed)
Spoke with patient about abnormal Exercise tolerance results.   Orders placed for exercise myoview.   Instructions reviewed with patient. I advised the patient that someone from scheduling will contact her to get her test scheduled. She voiced understanding.   Myoview instructions letter sent to the patient via mychart.

## 2023-03-06 ENCOUNTER — Telehealth (HOSPITAL_COMMUNITY): Payer: Self-pay | Admitting: *Deleted

## 2023-03-06 NOTE — Telephone Encounter (Signed)
Left message on voicemail per DPR in reference to upcoming appointment scheduled on 03/14/23 with detailed instructions given per Myocardial Perfusion Study Information Sheet for the test. LM to arrive 15 minutes early, and that it is imperative to arrive on time for appointment to keep from having the test rescheduled. If you need to cancel or reschedule your appointment, please call the office within 24 hours of your appointment. Failure to do so may result in a cancellation of your appointment, and a $50 no show fee. Phone number given for call back for any questions. Ricky Ala

## 2023-03-13 ENCOUNTER — Telehealth: Payer: Self-pay | Admitting: Pharmacy Technician

## 2023-03-13 ENCOUNTER — Other Ambulatory Visit (HOSPITAL_COMMUNITY): Payer: Self-pay

## 2023-03-13 NOTE — Telephone Encounter (Signed)
Pharmacy Patient Advocate Encounter   Received notification from Fax that prior authorization for repatha is required/requested.   Insurance verification completed.   The patient is insured through [facility] .   Per test claim: PA required; PA submitted to [facility] via Fax Key/confirmation #[id number] FAX  Status is pending

## 2023-03-14 ENCOUNTER — Ambulatory Visit (HOSPITAL_COMMUNITY): Payer: Federal, State, Local not specified - PPO | Attending: Internal Medicine

## 2023-03-14 DIAGNOSIS — I10 Essential (primary) hypertension: Secondary | ICD-10-CM | POA: Insufficient documentation

## 2023-03-14 DIAGNOSIS — R9430 Abnormal result of cardiovascular function study, unspecified: Secondary | ICD-10-CM | POA: Diagnosis present

## 2023-03-14 DIAGNOSIS — R079 Chest pain, unspecified: Secondary | ICD-10-CM | POA: Insufficient documentation

## 2023-03-14 LAB — MYOCARDIAL PERFUSION IMAGING
Angina Index: 0
Duke Treadmill Score: 5
Estimated workload: 7
Exercise duration (min): 5 min
Exercise duration (sec): 1 s
LV dias vol: 45 mL (ref 46–106)
LV sys vol: 11 mL
MPHR: 151 {beats}/min
Nuc Stress EF: 76 %
Peak HR: 144 {beats}/min
Percent HR: 95 %
Rest HR: 72 {beats}/min
Rest Nuclear Isotope Dose: 10.7 mCi
SDS: 2
SRS: 2
SSS: 4
ST Depression (mm): 0 mm
Stress Nuclear Isotope Dose: 30.4 mCi
TID: 0.8

## 2023-03-14 MED ORDER — TECHNETIUM TC 99M TETROFOSMIN IV KIT
10.7000 | PACK | Freq: Once | INTRAVENOUS | Status: AC | PRN
  Administered 2023-03-14: 10.7 via INTRAVENOUS

## 2023-03-14 MED ORDER — TECHNETIUM TC 99M TETROFOSMIN IV KIT: 30.4000 | PACK | Freq: Once | INTRAVENOUS | Status: AC | PRN

## 2023-03-15 ENCOUNTER — Other Ambulatory Visit (HOSPITAL_COMMUNITY): Payer: Self-pay

## 2023-03-15 NOTE — Telephone Encounter (Addendum)
Pharmacy Patient Advocate Encounter  Received notification from  [employer] federal employees  that Prior Authorization for repatha has been APPROVED from 02/12/23 to 03/13/24. Ran test claim, Copay is $60.00. This test claim was processed through [facility]- copay amounts may vary at other pharmacies due to pharmacy/plan contracts, or as the patient moves through the different stages of their insurance plan.   PA #/Case ID/Reference #:    MEDIA ATTACHED

## 2023-03-27 ENCOUNTER — Other Ambulatory Visit: Payer: Self-pay

## 2023-03-27 MED ORDER — HYDROCHLOROTHIAZIDE 25 MG PO TABS: 25.0000 mg | ORAL_TABLET | Freq: Every day | ORAL | 3 refills | Status: DC

## 2023-05-30 ENCOUNTER — Encounter: Payer: Self-pay | Admitting: Cardiovascular Disease

## 2023-08-01 ENCOUNTER — Ambulatory Visit: Payer: Federal, State, Local not specified - PPO | Admitting: Nurse Practitioner

## 2023-08-06 ENCOUNTER — Ambulatory Visit: Payer: Federal, State, Local not specified - PPO | Admitting: Nurse Practitioner

## 2023-08-06 NOTE — Progress Notes (Unsigned)
Cardiology Office Note    Patient Name: Desiree Bryant Date of Encounter: 08/08/2023  Primary Care Provider:  Farris Has, MD Primary Cardiologist:  None Primary Electrophysiologist: None   Past Medical History    Past Medical History:  Diagnosis Date   Arthritis    Bladder neoplasm    Cataract    Frequency of urination    Heart murmur    Hepatitis    Hypertension    Hypothyroidism    Neuromuscular disorder (HCC)    carpal tunnel   Pneumonia    PONV (postoperative nausea and vomiting)    SEVERE    History of Present Illness  Desiree Bryant is a 68 y.o. female with PMH of atypical chest pain, minimal CAD via cardiac CTA hypothyroidism, bladder neoplasm who presents today for 47-month follow-up.  Desiree Bryant was last seen on 01/2023 for follow up and some CP and occasion episode of palpitations that awaken her from sleep. She completed a POET that showed abnormal ST elevation and was advised to underwent a Myoview for further evaluation that showed no reversible ischemia and was considered low risk.    Desiree Bryant presents today for 38-month follow-up.  She reports since her previous follow-up experiencing occasional mild chest pain that is usually associated with stress or overexertion.  She denies any pain today during her visit.  The pain does not radiate down the arms or up the neck. The patient also reports episodes of waking up at night with pain going down the arms, although this has not occurred for at least a month. The patient has not noticed any correlation between this pain and activity levels. The patient's blood pressure and cholesterol levels have been stable, and the patient is making efforts to lose weight.  She reports no dyspnea, PND, orthopnea, nausea, vomiting, dizziness, syncope, edema, weight gain, or early satiety.  Review of Systems  Please see the history of present illness.    All other systems reviewed and are otherwise negative except as noted  above.  Physical Exam    Wt Readings from Last 3 Encounters:  08/08/23 160 lb 12.8 oz (72.9 kg)  03/14/23 164 lb (74.4 kg)  02/02/23 164 lb 13.9 oz (74.8 kg)   VS: Vitals:   08/08/23 1023  BP: 138/80  Pulse: 70  SpO2: 98%  ,Body mass index is 28.48 kg/m. GEN: Well nourished, well developed in no acute distress Neck: No JVD; No carotid bruits Pulmonary: Clear to auscultation without rales, wheezing or rhonchi  Cardiovascular: Normal rate. Regular rhythm. Normal S1. Normal S2.   Murmurs: There is no murmur.  ABDOMEN: Soft, non-tender, non-distended EXTREMITIES:  No edema; No deformity   EKG/LABS/ Recent Cardiac Studies   ECG personally reviewed by me today -none completed today  Risk Assessment/Calculations:          Lab Results  Component Value Date   WBC 4.1 01/03/2022   HGB 15.3 (H) 01/03/2022   HCT 45.7 01/03/2022   MCV 94.4 01/03/2022   PLT 275 01/03/2022   Lab Results  Component Value Date   CREATININE 0.84 01/25/2022   BUN 13 01/25/2022   NA 145 (H) 01/25/2022   K 4.1 01/25/2022   CL 101 01/25/2022   CO2 26 01/25/2022   Lab Results  Component Value Date   CHOL 180 03/01/2023   HDL 81 03/01/2023   LDLCALC 78 03/01/2023   TRIG 121 03/01/2023   CHOLHDL 2.2 03/01/2023    No results found for: "HGBA1C"  Assessment & Plan    1.Essential hypertension: -Patient's blood pressure today was controlled at 122/84 -Continue HCTZ 25 mg daily  2.Chest pain: -Patient underwent a POET for evaluation that showed abnormal ST elevation and further evaluation with exercise Myoview that was normal. -Today patient reports ongoing occurrence of discomfort that is associated with stress or overexertion. -She reports a previous intolerance to Norvasc with flushing that occurred over 20 years ago. -Through shared decision we will rechallenge Norvasc at 2.5 mg with close follow-up of symptoms. -Patient advised to use as needed Nitrostat if chest discomfort returns and is  not resolved with rest.  3.Hyperlipidemia: -Patient's LDL was 78 improved less than 100 at goal -Continue Repatha 140 mg q. 14 days  4.Hypothyroidism:  -Continue current treatment plan per PCP -Patient's last TSH was 2.4  Disposition: Follow-up with Dr. Clifton James or APP in 12 months    Signed, Napoleon Form, Leodis Rains, NP 08/08/2023, 10:50 AM [facility]

## 2023-08-08 ENCOUNTER — Ambulatory Visit: Payer: Federal, State, Local not specified - PPO | Attending: Nurse Practitioner | Admitting: Nurse Practitioner

## 2023-08-08 ENCOUNTER — Encounter: Payer: Self-pay | Admitting: Nurse Practitioner

## 2023-08-08 VITALS — BP 122/84 | HR 70 | Ht 63.0 in | Wt 160.8 lb

## 2023-08-08 DIAGNOSIS — R079 Chest pain, unspecified: Secondary | ICD-10-CM | POA: Diagnosis not present

## 2023-08-08 DIAGNOSIS — E782 Mixed hyperlipidemia: Secondary | ICD-10-CM

## 2023-08-08 DIAGNOSIS — E039 Hypothyroidism, unspecified: Secondary | ICD-10-CM | POA: Diagnosis not present

## 2023-08-08 DIAGNOSIS — I10 Essential (primary) hypertension: Secondary | ICD-10-CM

## 2023-08-08 MED ORDER — AMLODIPINE BESYLATE 2.5 MG PO TABS
2.5000 mg | ORAL_TABLET | Freq: Every day | ORAL | 3 refills | Status: DC
Start: 2023-08-08 — End: 2023-08-15

## 2023-08-08 NOTE — Patient Instructions (Addendum)
Medication Instructions:    Start taking Amlodipine 2.5 mg  one tablet daily   *If you need a refill on your cardiac medications before your next appointment, please call your pharmacy*   Lab Work:  Not needed   Testing/Procedures: Not needed   Follow-Up: At [facility], you and your health needs are our priority.  As part of our continuing mission to provide you with exceptional heart care, we have created designated Provider Care Teams.  These Care Teams include your primary Cardiologist (physician) and Advanced Practice Providers (APPs -  Physician Assistants and Nurse Practitioners) who all work together to provide you with the care you need, when you need it.     Your next appointment:   12 month(s)  The format for your next appointment:   In Person  Provider:   Verne Carrow, MD

## 2023-08-31 ENCOUNTER — Ambulatory Visit: Admitting: Podiatry

## 2023-08-31 ENCOUNTER — Encounter: Payer: Self-pay | Admitting: Podiatry

## 2023-08-31 DIAGNOSIS — M216X1 Other acquired deformities of right foot: Secondary | ICD-10-CM

## 2023-08-31 DIAGNOSIS — L84 Corns and callosities: Secondary | ICD-10-CM

## 2023-08-31 DIAGNOSIS — M216X2 Other acquired deformities of left foot: Secondary | ICD-10-CM | POA: Diagnosis not present

## 2023-08-31 DIAGNOSIS — L600 Ingrowing nail: Secondary | ICD-10-CM

## 2023-08-31 MED ORDER — CLINDAMYCIN HCL 300 MG PO CAPS: 300.0000 mg | ORAL_CAPSULE | Freq: Three times a day (TID) | ORAL | 0 refills | Status: DC

## 2023-08-31 NOTE — Patient Instructions (Signed)

## 2023-08-31 NOTE — Progress Notes (Signed)
 Subjective:   Patient ID: Desiree Bryant, female   DOB: 68 y.o.   MRN: [id number]   HPI Chief Complaint  Patient presents with   Callouses    RM#11 Patient states has calluses causing discomfort and right foot big toe possible ingrown.   68 year old female presents the office today with concerns of ingrown toenail right big toe, medial aspect as well as for calluses on the bottoms of her feet causing discomfort.  She denies any drainage or pus coming from the toenail.  No open lesions identified.  Ingrown toenails been ongoing for many years and she tried trimming it helps for a short amount of time.  She uses a pumice stone on the calluses.  No injuries.  Review of Systems  All other systems reviewed and are negative.  Past Medical History:  Diagnosis Date   Arthritis    Bladder neoplasm    Cataract    Frequency of urination    Heart murmur    Hepatitis    Hypertension    Hypothyroidism    Neuromuscular disorder (HCC)    carpal tunnel   Pneumonia    PONV (postoperative nausea and vomiting)    SEVERE    Past Surgical History:  Procedure Laterality Date   ABDOMINAL HYSTERECTOMY     ABDOMINOPLASTY  2009   CARDIAC CATHETERIZATION  2006  ([place])   normal coronaries   CARDIOVASCULAR STRESS TEST  03-26-2010  dr Caro Hight   normal study   CHOLECYSTECTOMY N/A 11/29/2020   Procedure: LAPAROSCOPIC CHOLECYSTECTOMY;  Surgeon: Fritzi Mandes, MD;  Location: [facility] OR;  Service: General;  Laterality: N/A;   COLONOSCOPY WITH ESOPHAGOGASTRODUODENOSCOPY (EGD)  2014   CYSTOSCOPY WITH BIOPSY N/A 11/07/2013   Procedure: CYSTOSCOPY WITH BLADDER BIOPSY/FULGURATION;  Surgeon: Jerilee Field, MD;  Location: [facility];  Service: Urology;  Laterality: N/A;   TOTAL ABDOMINAL HYSTERECTOMY W/ BILATERAL SALPINGOOPHORECTOMY  2002     Current Outpatient Medications:    Aloe Vera 470 MG CAPS, Take 470 mg by mouth daily., Disp: , Rfl:    Chromium 200 MCG TABS, Take 200 mcg by mouth  daily., Disp: , Rfl:    clindamycin (CLEOCIN) 300 MG capsule, Take 1 capsule (300 mg total) by mouth 3 (three) times daily., Disp: 21 capsule, Rfl: 0   Evolocumab (REPATHA SURECLICK) 140 MG/ML SOAJ, Inject 140 mg into the skin every 14 (fourteen) days., Disp: 2 mL, Rfl: 11   hydrochlorothiazide (HYDRODIURIL) 25 MG tablet, Take 1 tablet (25 mg total) by mouth daily., Disp: 90 tablet, Rfl: 3   levothyroxine (SYNTHROID) 50 MCG tablet, Take 50 mcg by mouth daily before breakfast., Disp: , Rfl:    Magnesium 400 MG CAPS, Take 400 mg by mouth daily., Disp: , Rfl:    PANTOTHENIC ACID PO, Take 50 mg by mouth daily., Disp: , Rfl:    selenium 200 MCG TABS tablet, Take 200 mcg by mouth daily., Disp: , Rfl:    ZINC SULFATE PO, Take 25 mg by mouth daily., Disp: , Rfl:    clobetasol (TEMOVATE) 0.05 % external solution, Apply topically 2 (two) times daily., Disp: , Rfl:    meloxicam (MOBIC) 15 MG tablet, Take 15 mg by mouth daily. (Patient not taking: Reported on 08/31/2023), Disp: , Rfl:    nitroGLYCERIN (NITROSTAT) 0.4 MG SL tablet, Place 1 tablet (0.4 mg total) under the tongue every 5 (five) minutes as needed., Disp: 25 tablet, Rfl: 3 No current facility-administered medications for this visit.  Facility-Administered Medications Ordered  in Other Visits:    technetium tetrofosmin (TC-MYOVIEW) injection 30.4 millicurie, 30.4 millicurie, Intravenous, Once PRN, Hilty, Lisette Abu, MD  Allergies  Allergen Reactions   Atorvastatin Other (See Comments)   Penicillins Hives, Swelling and Other (See Comments)   Pravastatin Sodium Other (See Comments)   Procaine Other (See Comments)   Strawberry Extract Hives   Yellow Dyes (Non-Tartrazine) Other (See Comments)   Food Color Red Rash   Food Color Yellow Rash   Ginger Rash   Peanut-Containing Drug Products Rash   Turmeric Rash          Objective:  Physical Exam  General: AAO x3, NAD  Dermatological: Hyperkeratotic lesions noted submetatarsal 5 bilaterally  as well as left submetatarsal 3 without any underlying ulceration, drainage or signs of infection.  Incurvation present to right medial hallux nail border with tissue overlying the nail.  There is localized edema with no erythema or warmth or any signs of infection.  There are no open lesions.  Vascular: Dorsalis Pedis artery and Posterior Tibial artery pedal pulses are 2/4 bilateral with immedate capillary fill time. There is no pain with calf compression, swelling, warmth, erythema.   Neruologic: Grossly intact via light touch bilateral.   Musculoskeletal: Tenderness to ingrown toenail.  Prominent metatarsal heads bilaterally.  Gait: Unassisted, Nonantalgic.    Assessment:   Ingrown toenail right medial hallux, hyperkeratotic lesions     Plan:  -Treatment options discussed including all alternatives, risks, and complications -Etiology of symptoms were discussed -At this time, the patient is requesting partial nail removal with chemical matricectomy to the symptomatic portion of the nail. Risks and complications were discussed with the patient for which they understand and written consent was obtained. Under sterile conditions a total of 3 mL of 2% lidocaine plain  was infiltrated in a hallux block fashion. Once anesthetized, the skin was prepped in sterile fashion. A tourniquet was then applied. Next the medial aspect of hallux nail border was then sharply excised making sure to remove the entire offending nail border. Once the nails were ensured to be removed area was debrided and the underlying skin was intact. There is no purulence identified in the procedure. Next phenol was then applied under standard conditions and copiously irrigated. Silvadene was applied. A dry sterile dressing was applied. After application of the dressing the tourniquet was removed and there is found to be an immediate capillary refill time to the digit. The patient tolerated the procedure well any complications. Post  procedure instructions were discussed the patient for which he verbally understood. Discussed signs/symptoms of infection and directed to call the office immediately should any occur or go directly to the emergency room. In the meantime, encouraged to call the office with any questions, concerns, changes symptoms. -Clindamycin as she has been on this previously without any side effects. -Debrided hyperkeratotic lesions of the courtesy of any complications or bleeding.  Discussed moisturizer to daily.    Vivi Barrack DPM

## 2023-09-14 ENCOUNTER — Encounter: Payer: Self-pay | Admitting: Podiatry

## 2023-09-14 ENCOUNTER — Ambulatory Visit: Admitting: Podiatry

## 2023-09-14 DIAGNOSIS — L600 Ingrowing nail: Secondary | ICD-10-CM

## 2023-09-14 DIAGNOSIS — M216X1 Other acquired deformities of right foot: Secondary | ICD-10-CM | POA: Diagnosis not present

## 2023-09-14 DIAGNOSIS — M216X2 Other acquired deformities of left foot: Secondary | ICD-10-CM

## 2023-09-14 DIAGNOSIS — L84 Corns and callosities: Secondary | ICD-10-CM

## 2023-09-17 NOTE — Progress Notes (Signed)
 Subjective: Chief Complaint  Patient presents with   Ingrown Toenail    RM#11 follow up right foot big toe nail and callus on left foot.   68 year old female presents the office today for follow-up evaluation status post partial nail avulsion on the right foot but she also gets a callus on the left foot that cause discomfort.  Ingrown toenail procedure sites been doing well.  Denies any drainage or pus or any pain.  She still been soaking Epsom salts.  No signs of infection she reports.  No other concerns.  Objective: AAO x3, NAD DP/PT pulses palpable bilaterally, CRT less than 3 seconds Status post partial avulsion of the right hallux with still some scabbing present but there is no drainage or pus or any cellulitis noted.  There is no pain on exam today.  On the left foot there is a hyperkeratotic lesion submetatarsal without any underlying ulceration, drainage or signs of infection. Prominent metatarsal heads. No pain with calf compression, swelling, warmth, erythema  Assessment: Ingrown toenail, healing well right foot; hyperkeratotic lesion left foot  Plan: -All treatment options discussed with the patient including all alternatives, risks, complications.  -Ingrown toenail procedure site appears to be healing well.  Continue soaking in salt water at least wash with soap and water daily.  Monitoring signs or symptoms of infection or any reoccurrence. -Sharply debrided hyperkeratotic lesion without any complications or bleeding.  Discussed moisturizer, offloading daily. -Patient encouraged to call the office with any questions, concerns, change in symptoms.   Vivi Barrack DPM

## 2023-10-18 ENCOUNTER — Other Ambulatory Visit: Payer: Self-pay | Admitting: Family Medicine

## 2023-10-18 DIAGNOSIS — R1084 Generalized abdominal pain: Secondary | ICD-10-CM

## 2023-10-18 DIAGNOSIS — R140 Abdominal distension (gaseous): Secondary | ICD-10-CM

## 2023-10-19 ENCOUNTER — Ambulatory Visit
Admission: RE | Admit: 2023-10-19 | Discharge: 2023-10-19 | Disposition: A | Discharge status: Home / Self Care | Source: Ambulatory Visit | Attending: Family Medicine | Admitting: Family Medicine

## 2023-10-19 DIAGNOSIS — R140 Abdominal distension (gaseous): Secondary | ICD-10-CM

## 2023-10-19 DIAGNOSIS — R1084 Generalized abdominal pain: Secondary | ICD-10-CM

## 2023-10-19 MED ORDER — IOPAMIDOL (ISOVUE-300) INJECTION 61%
100.0000 mL | Freq: Once | INTRAVENOUS | Status: AC | PRN
  Administered 2023-10-19: 100 mL via INTRAVENOUS

## 2024-01-01 ENCOUNTER — Other Ambulatory Visit: Payer: Self-pay | Admitting: Nurse Practitioner

## 2024-01-16 ENCOUNTER — Other Ambulatory Visit: Payer: Self-pay | Admitting: Nurse Practitioner

## 2024-03-28 ENCOUNTER — Telehealth: Payer: Self-pay

## 2024-03-28 ENCOUNTER — Telehealth (HOSPITAL_BASED_OUTPATIENT_CLINIC_OR_DEPARTMENT_OTHER): Payer: Self-pay | Admitting: *Deleted

## 2024-03-28 NOTE — Telephone Encounter (Signed)
 Per pt's notes from her call back with the scheduler Rojelio to make appt and leave her a vm. Tele preop appt 04/09/24 10 am. I will call the pt and leave vm as instructed.   We will need to review medications. Consent done as pt gave consent to the scheduler ok for tele appt.   I did call the pt to leave a vm and pt answered her phone. We spoke and reviewed her medications as well. Pt thanked me for the call.

## 2024-03-28 NOTE — Telephone Encounter (Signed)
   Name: Desiree Bryant  DOB: 03/11/1956  MRN: [id number]  Primary Cardiologist: Lonni Cash, MD   Preoperative team, please contact this patient and set up a phone call appointment for further preoperative risk assessment. Please obtain consent and complete medication review. Thank you for your help.  I confirm that guidance regarding antiplatelet and oral anticoagulation therapy has been completed and, if necessary, noted below.  Patient is not on anticoagulation or antiplatelet per review of medical record in Epic.    I also confirmed the patient resides in the state of [place] . As per [facility] Medical Board telemedicine laws, the patient must reside in the state in which the provider is licensed.   Lamarr Satterfield, NP 03/28/2024, 1:05 PM [facility]

## 2024-03-28 NOTE — Telephone Encounter (Signed)
 Pt called back and said to just make an appt and lvm with the information on her voicemail

## 2024-03-28 NOTE — Telephone Encounter (Signed)
 Per pt's notes from her call back with the scheduler Rojelio to make appt and leave her a vm. Tele preop appt 04/09/24 10 am. I will call the pt and leave vm as instructed.   We will need to review medications. Consent done as pt gave consent to the scheduler ok for tele appt.   I did call the pt to leave a vm and pt answered her phone. We spoke and reviewed her medications as well. Pt thanked me for the call.     Patient Consent for Virtual Visit        [name] has provided verbal consent on 03/28/2024 for a virtual visit (video or telephone).   CONSENT FOR VIRTUAL VISIT FOR:  Desiree Bryant  By participating in this virtual visit I agree to the following:  I hereby voluntarily request, consent and authorize [facility] and its employed or contracted physicians, physician assistants, nurse practitioners or other licensed health care professionals (the Practitioner), to provide me with telemedicine health care services (the "Services) as deemed necessary by the treating Practitioner. I acknowledge and consent to receive the Services by the Practitioner via telemedicine. I understand that the telemedicine visit will involve communicating with the Practitioner through live audiovisual communication technology and the disclosure of certain medical information by electronic transmission. I acknowledge that I have been given the opportunity to request an in-person assessment or other available alternative prior to the telemedicine visit and am voluntarily participating in the telemedicine visit.  I understand that I have the right to withhold or withdraw my consent to the use of telemedicine in the course of my care at any time, without affecting my right to future care or treatment, and that the Practitioner or I may terminate the telemedicine visit at any time. I understand that I have the right to inspect all information obtained and/or recorded in the course of the telemedicine  visit and may receive copies of available information for a reasonable fee.  I understand that some of the potential risks of receiving the Services via telemedicine include:  Delay or interruption in medical evaluation due to technological equipment failure or disruption; Information transmitted may not be sufficient (e.g. poor resolution of images) to allow for appropriate medical decision making by the Practitioner; and/or  In rare instances, security protocols could fail, causing a breach of personal health information.  Furthermore, I acknowledge that it is my responsibility to provide information about my medical history, conditions and care that is complete and accurate to the best of my ability. I acknowledge that Practitioner's advice, recommendations, and/or decision may be based on factors not within their control, such as incomplete or inaccurate data provided by me or distortions of diagnostic images or specimens that may result from electronic transmissions. I understand that the practice of medicine is not an exact science and that Practitioner makes no warranties or guarantees regarding treatment outcomes. I acknowledge that a copy of this consent can be made available to me via my patient portal [facility] MyChart), or I can request a printed copy by calling the office of [facility].    I understand that my insurance will be billed for this visit.   I have read or had this consent read to me. I understand the contents of this consent, which adequately explains the benefits and risks of the Services being provided via telemedicine.  I have been provided ample opportunity to ask questions regarding this consent and the Services  and have had my questions answered to my satisfaction. I give my informed consent for the services to be provided through the use of telemedicine in my medical care

## 2024-03-28 NOTE — Telephone Encounter (Signed)
Left message to call back for tele pre op appt

## 2024-03-28 NOTE — Telephone Encounter (Signed)
   Pre-operative Risk Assessment    Patient Name: Desiree Bryant  DOB: 21-Sep-1955 MRN: [id number]   Date of last office visit: 08/08/2023- Jackee Alberts NP Date of next office visit: None   Request for Surgical Clearance    Procedure:  Hemorrhoidectomy and rectal   Date of Surgery:  Clearance 04/17/24                                Surgeon:  Dr. Lonni Pizza MD Surgeon's Group or Practice Name:  [facility] Phone number:  [phone] Fax number:  [phone]   Type of Clearance Requested:   - Medical    Type of Anesthesia:  General    Additional requests/questions:    Bonney Huxley Sar[place] Selke   03/28/2024, 12:18 PM

## 2024-04-02 NOTE — Progress Notes (Signed)
 Sent message, via epic in basket, requesting orders in epic from Careers adviser.

## 2024-04-04 ENCOUNTER — Ambulatory Visit: Payer: Self-pay | Admitting: Surgery

## 2024-04-07 ENCOUNTER — Telehealth: Payer: Self-pay | Admitting: Pharmacy Technician

## 2024-04-07 ENCOUNTER — Other Ambulatory Visit (HOSPITAL_COMMUNITY): Payer: Self-pay

## 2024-04-07 NOTE — Telephone Encounter (Signed)
 Pharmacy Patient Advocate Encounter  Received notification from [facility] that Prior Authorization for repatha  has been APPROVED from 04/07/24 to 04/07/25   PA #/Case ID/Reference #: [id number]

## 2024-04-07 NOTE — Patient Instructions (Signed)
 SURGICAL WAITING ROOM VISITATION  Patients having surgery or a procedure may have no more than 2 support people in the waiting area - these visitors may rotate.    Children under the age of 62 must have an adult with them who is not the patient.  Visitors with respiratory illnesses are discouraged from visiting and should remain at home.  If the patient needs to stay at the hospital during part of their recovery, the visitor guidelines for inpatient rooms apply. Pre-op nurse will coordinate an appropriate time for 1 support person to accompany patient in pre-op.  This support person may not rotate.    Please refer to the [facility] website for the visitor guidelines for Inpatients (after your surgery is over and you are in a regular room).       Your procedure is scheduled on: 04/17/24   Report to [facility] Main Entrance    Report to admitting at: 1:00 PM   Call this number if you have problems the morning of surgery [phone]   Do not eat food :After Midnight.   After Midnight you may have the following liquids until : 12:00 PM DAY OF SURGERY  Water  Non-Citrus Juices (without pulp, NO RED-Apple, White grape, White cranberry) Black Coffee (NO MILK/CREAM OR CREAMERS, sugar ok)  Clear Tea (NO MILK/CREAM OR CREAMERS, sugar ok) regular and decaf                             Plain Jell-O (NO RED)                                           Fruit ices (not with fruit pulp, NO RED)                                     Popsicles (NO RED)                                                               Sports drinks like Gatorade (NO RED)             FOLLOW BOWEL PREP AND ANY ADDITIONAL PRE OP INSTRUCTIONS YOU RECEIVED FROM YOUR SURGEON'S OFFICE!!!   Oral Hygiene is also important to reduce your risk of infection.                                    Remember - BRUSH YOUR TEETH THE MORNING OF SURGERY WITH YOUR REGULAR TOOTHPASTE  DENTURES WILL BE REMOVED PRIOR TO SURGERY PLEASE  DO NOT APPLY Poly grip OR ADHESIVES!!!   Do NOT smoke after Midnight   Stop all vitamins and herbal supplements 7 days before surgery.   Take these medicines the morning of surgery with A SIP OF WATER : levothyroxine.                              You may not have any metal on your  body including hair pins, jewelry, and body piercing             Do not wear make-up, lotions, powders, perfumes/cologne, or deodorant  Do not wear nail polish including gel and S&S, artificial/acrylic nails, or any other type of covering on natural nails including finger and toenails. If you have artificial nails, gel coating, etc. that needs to be removed by a nail salon please have this removed prior to surgery or surgery may need to be canceled/ delayed if the surgeon/ anesthesia feels like they are unable to be safely monitored.   Do not shave  48 hours prior to surgery.    Do not bring valuables to the hospital. [facility] IS NOT             RESPONSIBLE   FOR VALUABLES.   Contacts, glasses, dentures or bridgework may not be worn into surgery.   Bring small overnight bag day of surgery.   DO NOT BRING YOUR HOME MEDICATIONS TO THE HOSPITAL. PHARMACY WILL DISPENSE MEDICATIONS LISTED ON YOUR MEDICATION LIST TO YOU DURING YOUR ADMISSION IN THE HOSPITAL!    Patients discharged on the day of surgery will not be allowed to drive home.  Someone NEEDS to stay with you for the first 24 hours after anesthesia.   Special Instructions: Bring a copy of your healthcare power of attorney and living will documents the day of surgery if you haven't scanned them before.              Please read over the following fact sheets you were given: IF YOU HAVE QUESTIONS ABOUT YOUR PRE-OP INSTRUCTIONS PLEASE CALL [phone].   If you received a COVID test during your pre-op visit  it is requested that you wear a mask when out in public, stay away from anyone that may not be feeling well and notify your surgeon if you develop  symptoms. If you test positive for Covid or have been in contact with anyone that has tested positive in the last 10 days please notify you surgeon.    [facility] - Preparing for Surgery Before surgery, you can play an important role.  Because skin is not sterile, your skin needs to be as free of germs as possible.  You can reduce the number of germs on your skin by washing with CHG (chlorahexidine gluconate) soap before surgery.  CHG is an antiseptic cleaner which kills germs and bonds with the skin to continue killing germs even after washing. Please DO NOT use if you have an allergy to CHG or antibacterial soaps.  If your skin becomes reddened/irritated stop using the CHG and inform your nurse when you arrive at Short Stay. Do not shave (including legs and underarms) for at least 48 hours prior to the first CHG shower.  You may shave your face/neck.  Please follow these instructions carefully:  1.  Shower with CHG Soap the night before surgery ONLY (DO NOT USE THE SOAP THE MORNING OF SURGERY).  2.  If you choose to wash your hair, wash your hair first as usual with your normal  shampoo.  3.  After you shampoo, rinse your hair and body thoroughly to remove the shampoo.                             4.  Use CHG as you would any other liquid soap.  You can apply chg directly to the skin and wash.  Gently with a scrungie or clean washcloth.  5.  Apply the CHG Soap to your body ONLY FROM THE NECK DOWN.   Do   not use on face/ open                           Wound or open sores. Avoid contact with eyes, ears mouth and   genitals (private parts).                       Wash face,  Genitals (private parts) with your normal soap.             6.  Wash thoroughly, paying special attention to the area where your    surgery  will be performed.  7.  Thoroughly rinse your body with warm water  from the neck down.  8.  DO NOT shower/wash with your normal soap after using and rinsing off the CHG Soap.                 9.  Pat yourself dry with a clean towel.            10.  Wear clean pajamas.            11.  Place clean sheets on your bed the night of your first shower and do not  sleep with pets. Day of Surgery : Do not apply any CHG, lotions/deodorants the morning of surgery.  Please wear clean clothes to the hospital/surgery center.  FAILURE TO FOLLOW THESE INSTRUCTIONS MAY RESULT IN THE CANCELLATION OF YOUR SURGERY  PATIENT SIGNATURE_________________________________  NURSE SIGNATURE__________________________________  ________________________________________________________________________

## 2024-04-07 NOTE — Telephone Encounter (Signed)
 Pharmacy Patient Advocate Encounter   Received notification from CoverMyMeds that prior authorization for Rep[facility]  is required/requested.   Insurance verification completed.   The patient is insured through [employer].   Per test claim: PA required; PA submitted to above mentioned insurance via Latent Key/confirmation #/EOC [facility] Status is pending

## 2024-04-09 ENCOUNTER — Encounter (HOSPITAL_COMMUNITY): Payer: Self-pay

## 2024-04-09 ENCOUNTER — Other Ambulatory Visit: Payer: Self-pay

## 2024-04-09 ENCOUNTER — Ambulatory Visit: Attending: Cardiovascular Disease | Admitting: Nurse Practitioner

## 2024-04-09 ENCOUNTER — Encounter (HOSPITAL_COMMUNITY)
Admission: RE | Admit: 2024-04-09 | Discharge: 2024-04-09 | Disposition: A | Discharge status: Home / Self Care | Source: Ambulatory Visit | Attending: Surgery | Admitting: Surgery

## 2024-04-09 VITALS — BP 132/73 | HR 70 | Temp 97.8°F | Ht 63.0 in | Wt 155.0 lb

## 2024-04-09 DIAGNOSIS — Z01810 Encounter for preprocedural cardiovascular examination: Secondary | ICD-10-CM

## 2024-04-09 DIAGNOSIS — K649 Unspecified hemorrhoids: Secondary | ICD-10-CM | POA: Diagnosis not present

## 2024-04-09 DIAGNOSIS — E039 Hypothyroidism, unspecified: Secondary | ICD-10-CM | POA: Insufficient documentation

## 2024-04-09 DIAGNOSIS — Z01818 Encounter for other preprocedural examination: Secondary | ICD-10-CM | POA: Diagnosis present

## 2024-04-09 DIAGNOSIS — Z79890 Hormone replacement therapy (postmenopausal): Secondary | ICD-10-CM | POA: Insufficient documentation

## 2024-04-09 DIAGNOSIS — I10 Essential (primary) hypertension: Secondary | ICD-10-CM | POA: Diagnosis not present

## 2024-04-09 DIAGNOSIS — I2510 Atherosclerotic heart disease of native coronary artery without angina pectoris: Secondary | ICD-10-CM | POA: Diagnosis not present

## 2024-04-09 HISTORY — DX: Malignant (primary) neoplasm, unspecified: C80.1

## 2024-04-09 HISTORY — DX: Angina pectoris, unspecified: I20.9

## 2024-04-09 LAB — CBC
HCT: 46.5 % — ABNORMAL HIGH (ref 36.0–46.0)
Hemoglobin: 15.2 g/dL — ABNORMAL HIGH (ref 12.0–15.0)
MCH: 31 pg (ref 26.0–34.0)
MCHC: 32.7 g/dL (ref 30.0–36.0)
MCV: 94.9 fL (ref 80.0–100.0)
Platelets: 294 K/uL (ref 150–400)
RBC: 4.9 MIL/uL (ref 3.87–5.11)
RDW: 12.9 % (ref 11.5–15.5)
WBC: 5.9 K/uL (ref 4.0–10.5)
nRBC: 0 % (ref 0.0–0.2)

## 2024-04-09 LAB — BASIC METABOLIC PANEL WITH GFR
Anion gap: 10 (ref 5–15)
BUN: 14 mg/dL (ref 8–23)
CO2: 29 mmol/L (ref 22–32)
Calcium: 10.2 mg/dL (ref 8.9–10.3)
Chloride: 103 mmol/L (ref 98–111)
Creatinine, Ser: 0.82 mg/dL (ref 0.44–1.00)
GFR, Estimated: >60 mL/min (ref >60)
Glucose, Bld: 100 mg/dL — ABNORMAL HIGH (ref 70–99)
Potassium: 3.3 mmol/L — ABNORMAL LOW (ref 3.5–5.1)
Sodium: 142 mmol/L (ref 135–145)

## 2024-04-09 NOTE — Progress Notes (Signed)
 For Anesthesia: PCP - Kip Righter, MD  Cardiologist - Verlin Lonni BIRCH, MD  Clearance: Daneen Damien BROCKS, NP : 04/09/24 Bowel Prep reminder:  Chest x-ray -  EKG - 04/09/24 Stress Test - 03/14/23 ECHO -  Cardiac Cath - 2006 Pacemaker/ICD device last checked: Pacemaker orders received: Device Rep notified:  Spinal Cord Stimulator:N/A  Sleep Study - N/A CPAP -   Fasting Blood Sugar - N/A Checks Blood Sugar _____ times a day Date and result of last Hgb A1c-  Last dose of GLP1 agonist- N/A GLP1 instructions: Hold 7 days prior to schedule (Hold 24 hours-daily)   Last dose of SGLT-2 inhibitors- N/A SGLT-2 instructions: Hold 72 hours prior to surgery  Blood Thinner Instructions:N/A Last Dose: Time last taken:  Aspirin Instructions:N/A Last Dose: Time last taken:  Activity level: Can go up a flight of stairs and activities of daily living without stopping and without chest pain and/or shortness of breath   Able to exercise without chest pain and/or shortness of breath  Anesthesia review: Hx: HTN,Murmur,Chest pain.  Patient denies shortness of breath, fever, cough and chest pain at PAT appointment   Patient verbalized understanding of instructions that were reviewed over the telephone.

## 2024-04-09 NOTE — Progress Notes (Signed)
 Virtual Visit via Telephone Note   Because of Desiree Bryant co-morbid illnesses, she is at least at moderate risk for complications without adequate follow up.  This format is felt to be most appropriate for this patient at this time.  Due to technical limitations with video connection (technology), today's appointment will be conducted as an audio only telehealth visit, and AMAZIN PINCOCK verbally agreed to proceed in this manner.   All issues noted in this document were discussed and addressed.  No physical exam could be performed with this format.  Evaluation Performed:  Preoperative cardiovascular risk assessment _____________   Date:  04/09/2024   Patient ID:  Desiree Bryant, DOB 10/02/1955, MRN [id number] Patient Location:  Home Provider location:   Office  Primary Care Provider:  Kip Righter, MD Primary Cardiologist:  Lonni Cash, MD  Chief Complaint / Patient Profile   68 y.o. y/o female with a h/o minimal nonobstructive CAD, hypertension, hyperlipidemia, hypothyroidism, and bladder neoplasm who is pending hemorrhoidectomy and rectal on 04/17/2024 with Dr. Lonni Pizza of [facility] surgery and presents today for telephonic preoperative cardiovascular risk assessment.  History of Present Illness    Desiree Bryant is a 68 y.o. female who presents via audio/video conferencing for a telehealth visit today.  Pt was last seen in cardiology clinic on 08/08/2023 by Jackee Alberts, NP.  At that time TRUE SHACKLEFORD was doing well.  The patient is now pending procedure as outlined above. Since her last visit, she has done well from a cardiac standpoint.  She notes occasional chest discomfort, unchanged from prior visits.  She exercises regularly at the gym without difficulty.  She denies palpitations, dyspnea, pnd, orthopnea, n, v, dizziness, syncope, edema, weight gain, or early satiety. All other systems reviewed and are otherwise negative except as noted above.    Past Medical History    Past Medical History:  Diagnosis Date   Arthritis    Bladder neoplasm    Cataract    Frequency of urination    Heart murmur    Hepatitis    Hypertension    Hypothyroidism    Neuromuscular disorder (HCC)    carpal tunnel   Pneumonia    PONV (postoperative nausea and vomiting)    SEVERE   Past Surgical History:  Procedure Laterality Date   ABDOMINAL HYSTERECTOMY     ABDOMINOPLASTY  2009   CARDIAC CATHETERIZATION  2006  ([place]   normal coronaries   CARDIOVASCULAR STRESS TEST  03-26-2010  dr ginny   normal study   CHOLECYSTECTOMY N/A 11/29/2020   Procedure: LAPAROSCOPIC CHOLECYSTECTOMY;  Surgeon: Dasie Leonor CROME, MD;  Location: [facility] OR;  Service: General;  Laterality: N/A;   COLONOSCOPY WITH ESOPHAGOGASTRODUODENOSCOPY (EGD)  2014   CYSTOSCOPY WITH BIOPSY N/A 11/07/2013   Procedure: CYSTOSCOPY WITH BLADDER BIOPSY/FULGURATION;  Surgeon: Donnice Brooks, MD;  Location: [facility];  Service: Urology;  Laterality: N/A;   TOTAL ABDOMINAL HYSTERECTOMY W/ BILATERAL SALPINGOOPHORECTOMY  2002    Allergies  Allergies  Allergen Reactions   Atorvastatin Other (See Comments)   Penicillins Hives, Swelling and Other (See Comments)   Pravastatin Sodium Other (See Comments)   Procaine Other (See Comments)   Strawberry Extract Hives   Yellow Dyes (Non-Tartrazine) Other (See Comments)   Food Color Red Rash   Food Color Yellow Rash   Ginger Rash   Peanut-Containing Drug Products Rash   Turmeric Rash    Home Medications    Prior to Admission medications  Medication Sig Start Date End Date Taking? Authorizing Provider  Aloe Vera 470 MG CAPS Take 470 mg by mouth daily.    [provider]  Chromium 200 MCG TABS Take 200 mcg by mouth daily.    [provider]  Evolocumab  (REPATHA  SURECLICK) 140 MG/ML SOAJ Inject 140 mg into the skin every 14 (fourteen) days. 01/16/24   Wyn Jackee VEAR Mickey., NP  hydrochlorothiazide  (HYDRODIURIL )  25 MG tablet Take 1 tablet (25 mg total) by mouth daily. 01/01/24   Dick, Ernest H Jr., NP  levothyroxine (SYNTHROID) 50 MCG tablet Take 50 mcg by mouth daily before breakfast.    [provider]  Magnesium 400 MG CAPS Take 400 mg by mouth daily.    [provider]  nitroGLYCERIN  (NITROSTAT ) 0.4 MG SL tablet Place 1 tablet (0.4 mg total) under the tongue every 5 (five) minutes as needed. 02/02/23 04/07/24  Wyn Jackee VEAR Mickey., NP  PANTOTHENIC ACID PO Take 50 mg by mouth daily.    [provider]  selenium 200 MCG TABS tablet Take 200 mcg by mouth daily.    [provider]  ZINC SULFATE PO Take 25 mg by mouth daily.    [provider]    Physical Exam    Vital Signs:  HAYSLEE CASEBOLT does not have vital signs available for review today.  Given telephonic nature of communication, physical exam is limited. AAOx3. NAD. Normal affect.  Speech and respirations are unlabored.  Accessory Clinical Findings    None  Assessment & Plan    1.  Preoperative Cardiovascular Risk Assessment:  According to the Revised Cardiac Risk Index (RCRI), her Perioperative Risk of Major Cardiac Event is (%): 0.4. Her Functional Capacity in METs is: 6.27 according to the Duke Activity Status Index (DASI). Therefore, based on ACC/AHA guidelines, patient would be at acceptable risk for the planned procedure without further cardiovascular testing.  The patient was advised that if she develops new symptoms prior to surgery to contact our office to arrange for a follow-up visit, and she verbalized understanding.  A copy of this note will be routed to requesting surgeon.  Time:   Today, I have spent 5 minutes with the patient with telehealth technology discussing medical history, symptoms, and management plan.     Damien JAYSON Braver, NP  04/09/2024, 10:09 AM

## 2024-04-11 NOTE — Progress Notes (Signed)
 Anesthesia Chart Review   Case: [id number] Date/Time: 04/17/24 1513   Procedures:      HEMORRHOIDECTOMY     EXAM UNDER ANESTHESIA, RECTUM - ANORECTAL EXAM UNDER ANESTHESIA   Anesthesia type: General   Pre-op diagnosis: HEMORRHOIDS   [facility] ROOM 02 / [facility]   Surgeons: Teresa Lonni HERO, MD       DISCUSSION:68 y.o. never smoker with h/o PONV, HTN, hypothyroidism on Synthroid, minimal CAD via cardiac CTA, hemorrhoids scheduled for above procedure with Dr. Lonni Teresa.   Per anesthesia note 11/29/2020 with with PONV, thinks she did well with GA for cysto in 2015- had scop patch + inhalational   Per airway note,  Anterior airway, grade 2b view with DL- recc miller or glide for subsequent intubations   Pt had POET that showed abnormal ST elevation, then had exercise Myoview  which was normal. She reports ongoing chest discomfort with stress and overexertion, she has nitrostat  for these sx.   Per most recent cardio note continued occasional chest discomfort which is unchanged.  Pt exercises at the gym regularly without difficulty.   Per cardiology preoperative evaluation 04/09/24, According to the Revised Cardiac Risk Index (RCRI), her Perioperative Risk of Major Cardiac Event is (%): 0.4. Her Functional Capacity in METs is: 6.27 according to the Duke Activity Status Index (DASI). Therefore, based on ACC/AHA guidelines, patient would be at acceptable risk for the planned procedure without further cardiovascular testing.  VS: BP 132/73   Pulse 70   Temp 36.6 C (Oral)   Ht 5' 3 (1.6 m)   Wt 70.3 kg   SpO2 97%   BMI 27.46 kg/m   PROVIDERS: Kip Righter, MD is PCP   Cardiologist - Verlin Lonni BIRCH, MD  LABS: Labs reviewed: Acceptable for surgery. (all labs ordered are listed, but only abnormal results are displayed)  Labs Reviewed  BASIC METABOLIC PANEL WITH GFR - Abnormal; Notable for the following components:      Result Value   Potassium 3.3 (*)     Glucose, Bld 100 (*)    All other components within normal limits  CBC - Abnormal; Notable for the following components:   Hemoglobin 15.2 (*)    HCT 46.5 (*)    All other components within normal limits     IMAGES:   EKG:   CV: Myocardial Perfusion 03/14/2023    Findings are consistent with no ischemia. The study is low risk.   No ST deviation was noted.   Left ventricular function is normal. Nuclear stress EF: 76%. The left ventricular ejection fraction is hyperdynamic (>65%). End diastolic cavity size is normal. End systolic cavity size is normal.   Prior study not available for comparison.   No reversible ischemia. LVEF 76% with normal wall motion. This is a low risk study. No prior for comparison. Past Medical History:  Diagnosis Date   Anginal pain    Arthritis    Bladder neoplasm    Cancer (HCC)    basal cell: scalp   Cataract    Frequency of urination    Heart murmur    Hepatitis    Hypertension    Hypothyroidism    Neuromuscular disorder (HCC)    carpal tunnel   Pneumonia    PONV (postoperative nausea and vomiting)    SEVERE    Past Surgical History:  Procedure Laterality Date   ABDOMINAL HYSTERECTOMY     ABDOMINOPLASTY  2009   ABLATION     lower back   CARDIAC  CATHETERIZATION  2006  ([place] )   normal coronaries   CARDIOVASCULAR STRESS TEST  03-26-2010  dr ginny   normal study   CATARACT EXTRACTION, BILATERAL Bilateral    CHOLECYSTECTOMY N/A 11/29/2020   Procedure: LAPAROSCOPIC CHOLECYSTECTOMY;  Surgeon: Dasie Leonor CROME, MD;  Location: [facility] OR;  Service: General;  Laterality: N/A;   COLONOSCOPY WITH ESOPHAGOGASTRODUODENOSCOPY (EGD)  2014   CYSTOSCOPY WITH BIOPSY N/A 11/07/2013   Procedure: CYSTOSCOPY WITH BLADDER BIOPSY/FULGURATION;  Surgeon: Donnice Brooks, MD;  Location: [facility];  Service: Urology;  Laterality: N/A;   DILATION AND CURETTAGE OF UTERUS     TOTAL ABDOMINAL HYSTERECTOMY W/ BILATERAL SALPINGOOPHORECTOMY  2002     MEDICATIONS:  Aloe Vera 470 MG CAPS   Chromium 200 MCG TABS   Evolocumab  (REPATHA  SURECLICK) 140 MG/ML SOAJ   hydrochlorothiazide  (HYDRODIURIL ) 25 MG tablet   levothyroxine (SYNTHROID) 50 MCG tablet   Magnesium 400 MG CAPS   nitroGLYCERIN  (NITROSTAT ) 0.4 MG SL tablet   PANTOTHENIC ACID PO   selenium 200 MCG TABS tablet   ZINC SULFATE PO   No current facility-administered medications for this encounter.    technetium tetrofosmin  (TC-MYOVIEW ) injection 30.4 millicurie      [facility], PA-C [facility] Pre-Surgical Testing [phone]

## 2024-04-11 NOTE — Anesthesia Preprocedure Evaluation (Addendum)
 Anesthesia Evaluation  Patient identified by MRN, date of birth, ID band Patient awake    Reviewed: Allergy & Precautions, H&P , NPO status , Patient's Chart, lab work & pertinent test results  History of Anesthesia Complications (+) PONV and history of anesthetic complications  Airway Mallampati: III  TM Distance: >3 FB Neck ROM: Full    Dental no notable dental hx. (+) Teeth Intact, Dental Advisory Given   Pulmonary neg pulmonary ROS   Pulmonary exam normal breath sounds clear to auscultation       Cardiovascular hypertension, Pt. on medications  Rhythm:Regular Rate:Normal     Neuro/Psych negative neurological ROS  negative psych ROS   GI/Hepatic negative GI ROS,,,(+) Hepatitis -  Endo/Other  Hypothyroidism    Renal/GU negative Renal ROS  negative genitourinary   Musculoskeletal  (+) Arthritis , Osteoarthritis,    Abdominal   Peds  Hematology negative hematology ROS (+)   Anesthesia Other Findings   Reproductive/Obstetrics negative OB ROS                              Anesthesia Physical Anesthesia Plan  ASA: 2  Anesthesia Plan: General   Post-op Pain Management: Tylenol  PO (pre-op)*   Induction: Intravenous  PONV Risk Score and Plan: 4 or greater and Ondansetron , Dexamethasone  and Treatment may vary due to age or medical condition  Airway Management Planned: Oral ETT  Additional Equipment:   Intra-op Plan:   Post-operative Plan: Extubation in OR  Informed Consent: I have reviewed the patients History and Physical, chart, labs and discussed the procedure including the risks, benefits and alternatives for the proposed anesthesia with the patient or authorized representative who has indicated his/her understanding and acceptance.     Dental advisory given  Plan Discussed with: CRNA  Anesthesia Plan Comments: (See PAT note 04/08/24)         Anesthesia Quick  Evaluation

## 2024-04-17 ENCOUNTER — Encounter (HOSPITAL_COMMUNITY): Payer: Self-pay | Admitting: Surgery

## 2024-04-17 ENCOUNTER — Encounter (HOSPITAL_COMMUNITY)
Admission: RE | Disposition: A | Discharge status: Home / Self Care | Payer: Self-pay | Source: Ambulatory Visit | Attending: Surgery

## 2024-04-17 ENCOUNTER — Ambulatory Visit (HOSPITAL_COMMUNITY): Admitting: Certified Registered Nurse Anesthetist

## 2024-04-17 ENCOUNTER — Other Ambulatory Visit: Payer: Self-pay

## 2024-04-17 ENCOUNTER — Ambulatory Visit (HOSPITAL_COMMUNITY)
Admission: RE | Admit: 2024-04-17 | Discharge: 2024-04-17 | Disposition: A | Discharge status: Home / Self Care | Source: Ambulatory Visit | Attending: Surgery | Admitting: Surgery

## 2024-04-17 ENCOUNTER — Ambulatory Visit (HOSPITAL_COMMUNITY): Payer: Self-pay | Admitting: Physician Assistant

## 2024-04-17 DIAGNOSIS — K648 Other hemorrhoids: Secondary | ICD-10-CM | POA: Diagnosis present

## 2024-04-17 DIAGNOSIS — K644 Residual hemorrhoidal skin tags: Secondary | ICD-10-CM | POA: Insufficient documentation

## 2024-04-17 DIAGNOSIS — I10 Essential (primary) hypertension: Secondary | ICD-10-CM | POA: Diagnosis not present

## 2024-04-17 HISTORY — PX: HEMORRHOID SURGERY: SHX153

## 2024-04-17 HISTORY — PX: RECTAL EXAM UNDER ANESTHESIA: SHX6399

## 2024-04-17 SURGERY — HEMORRHOIDECTOMY
Anesthesia: General | Site: Rectum

## 2024-04-17 MED ORDER — SUGAMMADEX SODIUM 200 MG/2ML IV SOLN
INTRAVENOUS | Status: AC
Start: 2024-04-17 — End: 2024-04-17
  Filled 2024-04-17: qty 2

## 2024-04-17 MED ORDER — FLEET ENEMA RE ENEM
1.0000 | ENEMA | Freq: Once | RECTAL | Status: DC
  Filled 2024-04-17: qty 1

## 2024-04-17 MED ORDER — DIPHENHYDRAMINE HCL 50 MG/ML IJ SOLN
INTRAMUSCULAR | Status: DC | PRN
  Administered 2024-04-17: 25 mg via INTRAVENOUS

## 2024-04-17 MED ORDER — PROPOFOL 10 MG/ML IV BOLUS
INTRAVENOUS | Status: AC
  Filled 2024-04-17: qty 20

## 2024-04-17 MED ORDER — DEXAMETHASONE SOD PHOSPHATE PF 10 MG/ML IJ SOLN
INTRAMUSCULAR | Status: DC | PRN
  Administered 2024-04-17: 10 mg via INTRAVENOUS

## 2024-04-17 MED ORDER — CHLORHEXIDINE GLUCONATE CLOTH 2 % EX PADS: 6.0000 | MEDICATED_PAD | Freq: Once | CUTANEOUS | Status: DC

## 2024-04-17 MED ORDER — ACETAMINOPHEN 500 MG PO TABS
1000.0000 mg | ORAL_TABLET | ORAL | Status: AC
  Administered 2024-04-17: 1000 mg via ORAL
  Filled 2024-04-17: qty 2

## 2024-04-17 MED ORDER — CHLORHEXIDINE GLUCONATE 0.12 % MT SOLN
15.0000 mL | Freq: Once | OROMUCOSAL | Status: AC
  Administered 2024-04-17: 15 mL via OROMUCOSAL

## 2024-04-17 MED ORDER — FENTANYL CITRATE (PF) 50 MCG/ML IJ SOSY: 25.0000 ug | PREFILLED_SYRINGE | INTRAMUSCULAR | Status: DC | PRN

## 2024-04-17 MED ORDER — DIBUCAINE (PERIANAL) 1 % EX OINT
TOPICAL_OINTMENT | CUTANEOUS | Status: AC
  Filled 2024-04-17: qty 28

## 2024-04-17 MED ORDER — BUPIVACAINE-EPINEPHRINE (PF) 0.25% -1:200000 IJ SOLN
INTRAMUSCULAR | Status: AC
  Filled 2024-04-17: qty 30

## 2024-04-17 MED ORDER — LIDOCAINE HCL (PF) 2 % IJ SOLN
INTRAMUSCULAR | Status: AC
  Filled 2024-04-17: qty 5

## 2024-04-17 MED ORDER — DIPHENHYDRAMINE HCL 50 MG/ML IJ SOLN
INTRAMUSCULAR | Status: AC
Start: 2024-04-17 — End: 2024-04-17
  Filled 2024-04-17: qty 1

## 2024-04-17 MED ORDER — LACTATED RINGERS IV SOLN: INTRAVENOUS | Status: DC

## 2024-04-17 MED ORDER — ROCURONIUM BROMIDE 10 MG/ML (PF) SYRINGE
PREFILLED_SYRINGE | INTRAVENOUS | Status: AC
  Filled 2024-04-17: qty 10

## 2024-04-17 MED ORDER — LIDOCAINE 2% (20 MG/ML) 5 ML SYRINGE
INTRAMUSCULAR | Status: DC | PRN
  Administered 2024-04-17: 80 mg via INTRAVENOUS

## 2024-04-17 MED ORDER — FENTANYL CITRATE (PF) 100 MCG/2ML IJ SOLN
INTRAMUSCULAR | Status: AC
  Filled 2024-04-17: qty 2

## 2024-04-17 MED ORDER — ONDANSETRON HCL 4 MG/2ML IJ SOLN
INTRAMUSCULAR | Status: DC | PRN
  Administered 2024-04-17: 4 mg via INTRAVENOUS

## 2024-04-17 MED ORDER — MIDAZOLAM HCL (PF) 2 MG/2ML IJ SOLN
INTRAMUSCULAR | Status: DC | PRN
  Administered 2024-04-17: 2 mg via INTRAVENOUS

## 2024-04-17 MED ORDER — SUGAMMADEX SODIUM 200 MG/2ML IV SOLN
INTRAVENOUS | Status: DC | PRN
  Administered 2024-04-17: 200 mg via INTRAVENOUS

## 2024-04-17 MED ORDER — TRAMADOL HCL 50 MG PO TABS: 50.0000 mg | ORAL_TABLET | Freq: Four times a day (QID) | ORAL | 0 refills | Status: AC | PRN

## 2024-04-17 MED ORDER — FENTANYL CITRATE (PF) 250 MCG/5ML IJ SOLN
INTRAMUSCULAR | Status: DC | PRN
  Administered 2024-04-17 (×2): 50 ug via INTRAVENOUS

## 2024-04-17 MED ORDER — BUPIVACAINE LIPOSOME 1.3 % IJ SUSP
INTRAMUSCULAR | Status: AC
  Filled 2024-04-17: qty 20

## 2024-04-17 MED ORDER — BUPIVACAINE-EPINEPHRINE (PF) 0.25% -1:200000 IJ SOLN
INTRAMUSCULAR | Status: DC | PRN
  Administered 2024-04-17: 50 mL

## 2024-04-17 MED ORDER — PROPOFOL 10 MG/ML IV BOLUS
INTRAVENOUS | Status: DC | PRN
  Administered 2024-04-17: 90 mg via INTRAVENOUS

## 2024-04-17 MED ORDER — ORAL CARE MOUTH RINSE: 15.0000 mL | Freq: Once | OROMUCOSAL | Status: AC

## 2024-04-17 MED ORDER — ONDANSETRON HCL 4 MG/2ML IJ SOLN
INTRAMUSCULAR | Status: AC
  Filled 2024-04-17: qty 2

## 2024-04-17 MED ORDER — ROCURONIUM 10MG/ML (10ML) SYRINGE FOR MEDFUSION PUMP - OPTIME
INTRAVENOUS | Status: DC | PRN
  Administered 2024-04-17: 50 mg via INTRAVENOUS

## 2024-04-17 MED ORDER — MIDAZOLAM HCL 2 MG/2ML IJ SOLN
INTRAMUSCULAR | Status: AC
Start: 2024-04-17 — End: 2024-04-17
  Filled 2024-04-17: qty 2

## 2024-04-17 MED ORDER — DEXMEDETOMIDINE HCL IN NACL 80 MCG/20ML IV SOLN
INTRAVENOUS | Status: DC | PRN
  Administered 2024-04-17: 10 ug via INTRAVENOUS

## 2024-04-17 MED ORDER — 0.9 % SODIUM CHLORIDE (POUR BTL) OPTIME
TOPICAL | Status: DC | PRN
Start: 2024-04-17 — End: 2024-04-17
  Administered 2024-04-17: 1000 mL

## 2024-04-17 SURGICAL SUPPLY — 39 items
BAG COUNTER SPONGE SURGICOUNT (BAG) IMPLANT
BENZOIN TINCTURE PRP APPL 2/3 (GAUZE/BANDAGES/DRESSINGS) IMPLANT
BLADE SURG 15 STRL LF DISP TIS (BLADE) IMPLANT
BRIEF MESH DISP LRG (UNDERPADS AND DIAPERS) ×1 IMPLANT
COVER SURGICAL LIGHT HANDLE (MISCELLANEOUS) ×1 IMPLANT
DISSECTOR SURG LIGASURE 21 (MISCELLANEOUS) IMPLANT
DRAPE LAPAROTOMY T 102X78X121 (DRAPES) ×1 IMPLANT
ELECT NDL BLADE 2-5/6 (NEEDLE) IMPLANT
ELECT NDL TIP 2.8 STRL (NEEDLE) ×1 IMPLANT
ELECT NEEDLE BLADE 2-5/6 (NEEDLE) IMPLANT
ELECT NEEDLE TIP 2.8 STRL (NEEDLE) ×1 IMPLANT
ELECT PENCIL ROCKER SW 15FT (MISCELLANEOUS) IMPLANT
ELECT REM PT RETURN 15FT ADLT (MISCELLANEOUS) ×1 IMPLANT
GAUZE 4X4 16PLY ~~LOC~~+RFID DBL (SPONGE) ×1 IMPLANT
GAUZE PAD ABD 8X10 STRL (GAUZE/BANDAGES/DRESSINGS) IMPLANT
GAUZE SPONGE 4X4 12PLY STRL (GAUZE/BANDAGES/DRESSINGS) IMPLANT
GLOVE BIO SURGEON STRL SZ7.5 (GLOVE) ×1 IMPLANT
GLOVE INDICATOR 8.0 STRL GRN (GLOVE) ×1 IMPLANT
GOWN STRL REUS W/ TWL XL LVL3 (GOWN DISPOSABLE) ×1 IMPLANT
KIT BASIN OR (CUSTOM PROCEDURE TRAY) ×1 IMPLANT
KIT TURNOVER KIT A (KITS) ×1 IMPLANT
LOOP VESSEL MAXI BLUE (MISCELLANEOUS) IMPLANT
NDL HYPO 22X1.5 SAFETY MO (MISCELLANEOUS) ×1 IMPLANT
NEEDLE HYPO 22X1.5 SAFETY MO (MISCELLANEOUS) ×1 IMPLANT
PACK BASIC VI WITH GOWN DISP (CUSTOM PROCEDURE TRAY) ×1 IMPLANT
PAK SCROTO (SET/KITS/TRAYS/PACK) IMPLANT
RETRACTOR RING URO 16.6X16.6 (MISCELLANEOUS) IMPLANT
RETRACTOR STAY HOOK 5MM (MISCELLANEOUS) IMPLANT
SCRUB CHG 4% DYNA-HEX 4OZ (MISCELLANEOUS) IMPLANT
SHEARS HARMONIC 9CM CVD (BLADE) IMPLANT
SPIKE FLUID TRANSFER (MISCELLANEOUS) ×1 IMPLANT
SURGILUBE 2OZ TUBE FLIPTOP (MISCELLANEOUS) ×1 IMPLANT
SUT CHROMIC 2 0 SH (SUTURE) IMPLANT
SUT CHROMIC 3 0 SH 27 (SUTURE) IMPLANT
SUT VIC AB 2-0 SH 27X BRD (SUTURE) IMPLANT
SUT VIC AB 2-0 UR6 27 (SUTURE) ×2 IMPLANT
SYR 20ML LL LF (SYRINGE) ×1 IMPLANT
TOWEL OR 17X26 10 PK STRL BLUE (TOWEL DISPOSABLE) ×1 IMPLANT
YANKAUER SUCT BULB TIP 10FT TU (MISCELLANEOUS) IMPLANT

## 2024-04-17 NOTE — H&P (Signed)
 CC: Here today for surgery  HPI: Desiree Bryant is an 68 y.o. female with history of HTN, hypothyroidism, whom is seen in the office today as a referral by Dr. Arch for evaluation of possible hemorrhoids.   Colonoscopy with Dr. Hurman -she reports this was done in the fall 2024. We are to have a copy of the report but she states she was told she had some diverticulosis without any evident polyps. She reports she was told she needed a repeat colonoscopy in 10 years, 2034.  She reports a history of hemorrhoids that dates back to the birth of her children now 40+ years ago. She states that since that time she has had issues with some tissue prolapse that is always out from the anal canal and does not ever go back in anymore. She denies any pain with bowel movements whatsoever. She denies any bright red blood per rectum. She denies any significant difficulties with hygiene. Reports that she has on average 2 bowel movements per day. Her hemorrhoids become inflamed if she has 3 bowel movements in a day. She is not currently taking any fiber supplements or stool softeners or laxatives. She does eat a fiber rich diet with fruits and vegetables. She drinks very little water  throughout the day and acknowledges she needs to drink more. She spends approximately 2 minutes on the commode with bowel movements and denies any straining.  She has been taking apple pectin as Benefiber seem to cause bloating. She also has psyllium on hand at home. Her stools are consistently soft. No straining. Spending 2 to 3 minutes on the commode. Drinking plenty of water . Denies any constipation. Despite these changes, she does note that she has had mild improvement in her hemorrhoidal issues but that they still persist. Still notices prolapsing that she will occasionally have to reduce from 1 side of the anal canal. Will prolapse approximately 1 cm. No significant pain/bleeding at present.  INTERVAL HX She denies any  changes in health or health history since we met in the office. No new medications/allergies. She states she is ready for surgery today.   PMH: HTN, hypothyroidism  PSH: Laparoscopic cholecystectomy for biliary dyskinesia-2022, Dr. Dasie. Denies any prior anorectal surgeries or procedures.  FHx: Denies any known family history of colorectal, breast, endometrial or ovarian cancer  Social Hx: Denies use of tobacco/EtOH/illicit drug. She is here today by herself   Past Medical History:  Diagnosis Date   Anginal pain    Arthritis    Bladder neoplasm    Cancer (HCC)    basal cell: scalp   Cataract    Frequency of urination    Heart murmur    Hepatitis    Hypertension    Hypothyroidism    Neuromuscular disorder (HCC)    carpal tunnel   Pneumonia    PONV (postoperative nausea and vomiting)    SEVERE    Past Surgical History:  Procedure Laterality Date   ABDOMINAL HYSTERECTOMY     ABDOMINOPLASTY  2009   ABLATION     lower back   CARDIAC CATHETERIZATION  2006  ([place] )   normal coronaries   CARDIOVASCULAR STRESS TEST  03-26-2010  dr ginny   normal study   CATARACT EXTRACTION, BILATERAL Bilateral    CHOLECYSTECTOMY N/A 11/29/2020   Procedure: LAPAROSCOPIC CHOLECYSTECTOMY;  Surgeon: Dasie Leonor CROME, MD;  Location: [facility] OR;  Service: General;  Laterality: N/A;   COLONOSCOPY WITH ESOPHAGOGASTRODUODENOSCOPY (EGD)  2014   CYSTOSCOPY WITH BIOPSY N/A 11/07/2013  Procedure: CYSTOSCOPY WITH BLADDER BIOPSY/FULGURATION;  Surgeon: Donnice Brooks, MD;  Location: [facility];  Service: Urology;  Laterality: N/A;   DILATION AND CURETTAGE OF UTERUS     TOTAL ABDOMINAL HYSTERECTOMY W/ BILATERAL SALPINGOOPHORECTOMY  2002    History reviewed. No pertinent family history.  Social:  reports that she has never smoked. She has never used smokeless tobacco. She reports that she does not drink alcohol and does not use drugs.  Allergies:  Allergies  Allergen Reactions    Atorvastatin Other (See Comments)   Penicillins Hives, Swelling and Other (See Comments)   Pravastatin Sodium Other (See Comments)   Procaine Other (See Comments)   Strawberry Extract Hives   Yellow Dyes (Non-Tartrazine) Other (See Comments)   Food Color Red Rash   Food Color Yellow Rash   Ginger Rash   Iodine Rash   Peanut-Containing Drug Products Rash   Turmeric Rash    Medications: I have reviewed the patient's current medications.  No results found for this or any previous visit (from the past 48 hours).  No results found.   PE Blood pressure (!) 140/76, pulse 84, temperature 98.3 F (36.8 C), temperature source Oral, resp. rate 16, height 5' 3 (1.6 m), weight 70.3 kg, SpO2 97%. Constitutional: NAD; conversant Eyes: Moist conjunctiva; no lid lag; anicteric Lungs: Normal respiratory effort CV: RRR Psychiatric: Appropriate affect  No results found for this or any previous visit (from the past 48 hours).  No results found.  A/P: Desiree Bryant is an 68 y.o. female with hx of HTN, hypothyroidism here for surgery today regarding external + internal hemorrhoids - currently decompressed  - The anatomy and physiology of the anal canal was discussed with her again today. The pathophysiology of hemorrhoids was discussed as well. - We have reviewed options going forward including further observation vs surgery - hemorrhoidectomy; anorectal exam under anesthesia - The planned procedure, material risks (including, but not limited to, pain, bleeding, infection, scarring, need for blood transfusion, damage to anal sphincter, incontinence of gas and/or stool, need for additional procedures, anal stenosis, rare cases of pelvic sepsis which in severe cases may require things like a colostomy, recurrence, blood clot, pulmonary embolus, pneumonia, heart attack, stroke, death) benefits and alternatives to surgery were discussed at length. I noted a good probability that the procedure would  help improve her symptoms. The patient's questions were answered to her satisfaction, she voiced understanding and elected to proceed with surgery. Additionally, we discussed typical postoperative expectations and the recovery process; understands she will need a driver home from procedure. - She did inquire at her office visit wheter antibiotics were given preoperatively we discussed. She indicated she may have had a small infection on her toe after a ingrown toenail removal. We discussed that generally for hemorrhoid surgery, we do not prophylactically give antibiotics as the risk of infectious complications from antibiotic therapy may be higher than when avoided altogether. She expressed understanding and is in agreement with this plan.    Lonni Pizza, MD [facility]

## 2024-04-17 NOTE — OR Nursing (Signed)
 Dibucaine Ointment labeled with patient sticker and placed in bag in patient's chart to be sent home with patient upon discharge per Dr. Teresa. Relayed this order to PACU nurse during patient handoff.

## 2024-04-17 NOTE — Discharge Instructions (Addendum)

## 2024-04-17 NOTE — Transfer of Care (Signed)
 Immediate Anesthesia Transfer of Care Note  Patient: Desiree Bryant  Procedure(s) Performed: HEMORRHOIDECTOMY (Rectum) EXAM UNDER ANESTHESIA, RECTUM (Rectum)  Patient Location: PACU  Anesthesia Type:General  Level of Consciousness: drowsy and patient cooperative  Airway & Oxygen Therapy: Patient Spontanous Breathing  Post-op Assessment: Report given to RN and Post -op Vital signs reviewed and stable  Post vital signs: Reviewed and stable  Last Vitals:  Vitals Value Taken Time  BP 147/70 04/17/24 15:49  Temp    Pulse 86 04/17/24 15:52  Resp 17 04/17/24 15:52  SpO2 90 % 04/17/24 15:52  Vitals shown include unfiled device data.  Last Pain:  Vitals:   04/17/24 1248  TempSrc: Oral  PainSc: 0-No pain         Complications: No notable events documented.

## 2024-04-17 NOTE — Op Note (Addendum)
 04/17/2024  3:33 PM  PATIENT:  Desiree Bryant  68 y.o. female  Patient Care Team: Kip Righter, MD as PCP - General (Family Medicine) Verlin Lonni BIRCH, MD as PCP - Cardiology (Cardiology) Verlin Lonni BIRCH, MD as Consulting Physician (Cardiology)  PRE-OPERATIVE DIAGNOSIS:  Hemorrhoids - mixed internal + external  POST-OPERATIVE DIAGNOSIS:  Same  PROCEDURE:   Two column hemorrhoidectomy Internal + external hemorrhoidectomy right anterior Internal hemorrhoidectomy right posterior Anorectal exam under anesthesia  SURGEON:  Surgeon(s): Teresa Lonni HERO, MD  ASSISTANT: OR Staff   ANESTHESIA:   local and general  SPECIMEN:   Right anterior hemorrhoidal tissue Right posterior hemorrhoidal tissue  DISPOSITION OF SPECIMEN:  PATHOLOGY  COUNTS:  Sponge, needle, and instrument counts were reported correct x2 at conclusion.  EBL: 5 mL  Drains: None  PLAN OF CARE: Discharge to home after PACU  PATIENT DISPOSITION:  PACU - hemodynamically stable.  OR FINDINGS: Prolapsing mixed internal/external hemorrhoid right anterior; prolapsing internal hemorrhoid right posterior.  Two column hemorrhoidectomy carried out uneventfully.  No other notable findings on circumferential anoscopy.  DESCRIPTION: The patient was seen in the pre-op holding area. The risks, benefits, complications, treatment options, and expected outcomes were previously discussed with the patient. The patient agreed with the proposed plan and has signed the informed consent form. The patient was brought to the operating room by the surgical team, identified as Desiree KANDICE. Anstine, and the procedure verified. SCD's were applied. General anesthesia was induced without difficulty. The patient was then rolled onto the OR table in the prone jackknife position.  Pressure points were evaluated and padded.  Benzoin was applied to the buttocks and they were gently taped apart.  She was then prepped and draped in usual  sterile fashion. A time out was completed and the above information confirmed and need for preoperative antibiotics.  A perianal block was then created using a dilute mixture of 0.25% Marcaine  with epinephrine  and Exparel .  After ascertaining an appropriate level of anesthesia had been achieved, a well lubricated digital rectal exam was performed. This demonstrated no palpable masses or other abnormalities.  Externally, there is a freely prolapsing mixed internal and external hemorrhoid the right anterior position.  Of note, there was mucus within the gluteal fold prior to prepping,  presumably from this prolapsing component. A Hill-Ferguson anoscope was into the anal canal and circumferential inspection demonstrated healthy appearing anoderm.  Prolapsing internal and external hemorrhoid in the right anterior position.  There is a prolapsing internal hemorrhoid in the right posterior position.  No other notable findings.  No fissures or granulation tissue in the anal canal.  Normal-appearing distal rectum.    We began with the largest component which is in the right anterior position.  With the anoscope in place, this is elevated with a DeBakey forcep.  Creating an ellipse type incision externally over the external hemorrhoidal component, this was carried down towards the anal opening.  Underlying sphincter muscles dissected free.  Plan dissection is maintained dissecting free the internal sphincter as well.  After this, the terminal portions of the hemorrhoidectomy were completed using a hand-held LigaSure device.  Specimen is passed off.  The area was inspected and noted to be hemostatic.  The resultant hemorrhoidal defect is then closed using a running 2-0 Vicryl suture after placing a suture ligature at the apex.  The closure was inspected and complete.  It is irrigated.  Hemostasis verified.  Attention was then directed at the smaller right posterior internal hemorrhoid.  With the anoscope in place,  this is elevated similarly using a DeBakey forcep.  This is incised using electrocautery and underlying internal sphincter muscles dissected free.  The remaining hemorrhoidal tissue was ligated and divided using the hand-held LigaSure device.  This resultant defect is quite small.  It is hemostatic.  It was however closed using a figure-of-eight 2-0 Vicryl suture.  The anal canal is irrigated.  Hemostasis is verified.  Additional local anesthetic is infiltrated at the hemorrhoidectomy sites.  There is no other evident or enlarged hemorrhoidal tissue within the anal canal or externally.  All sponge, needle, and instrument counts were reported correct.  The buttocks are untaped.  A dressing consisting of 4 x 4's, ABD, and mesh underwear is ultimately placed.  She was then rolled back onto a stretcher, awakened from anesthesia, extubated, and transported to the recovery room in satisfactory condition.  DISPOSITION: PACU in satisfactory condition.

## 2024-04-17 NOTE — Anesthesia Procedure Notes (Addendum)
 Procedure Name: Intubation Date/Time: 04/17/2024 2:55 PM  Performed by: Nanci Riis, CRNAPre-anesthesia Checklist: Patient identified, Emergency Drugs available, Suction available, Patient being monitored and Timeout performed Patient Re-evaluated:Patient Re-evaluated prior to induction Oxygen Delivery Method: Circle system utilized Preoxygenation: Pre-oxygenation with 100% oxygen Induction Type: IV induction Ventilation: Mask ventilation without difficulty Laryngoscope Size: Miller and 3 Grade View: Grade I Tube type: Oral Tube size: 7.0 mm Number of attempts: 1 Airway Equipment and Method: Stylet Placement Confirmation: ETT inserted through vocal cords under direct vision, positive ETCO2 and breath sounds checked- equal and bilateral Secured at: 21 cm Tube secured with: Tape Dental Injury: Teeth and Oropharynx as per pre-operative assessment

## 2024-04-17 NOTE — Anesthesia Postprocedure Evaluation (Signed)
 Anesthesia Post Note  Patient: Desiree Bryant  Procedure(s) Performed: HEMORRHOIDECTOMY (Rectum) EXAM UNDER ANESTHESIA, RECTUM (Rectum)     Patient location during evaluation: PACU Anesthesia Type: General Level of consciousness: awake and alert Pain management: pain level controlled Vital Signs Assessment: post-procedure vital signs reviewed and stable Respiratory status: spontaneous breathing, nonlabored ventilation and respiratory function stable Cardiovascular status: blood pressure returned to baseline and stable Postop Assessment: no apparent nausea or vomiting Anesthetic complications: no   No notable events documented.  Last Vitals:  Vitals:   04/17/24 1625 04/17/24 1630  BP: 124/81 139/81  Pulse: 73 79  Resp: 15 15  Temp:  (!) 35.9 C  SpO2: 96% 96%    Last Pain:  Vitals:   04/17/24 1630  TempSrc:   PainSc: 0-No pain                 Emmerson Shuffield,W. EDMOND

## 2024-04-18 ENCOUNTER — Encounter (HOSPITAL_COMMUNITY): Payer: Self-pay | Admitting: Surgery

## 2024-04-21 LAB — SURGICAL PATHOLOGY

## 2024-07-03 ENCOUNTER — Encounter: Payer: Self-pay | Admitting: Cardiovascular Disease

## 2024-10-02 ENCOUNTER — Ambulatory Visit: Admitting: Cardiovascular Disease
